# Patient Record
Sex: Female | Born: 1985 | Race: Black or African American | Hispanic: No | Marital: Married | Smoking: Never smoker
Health system: Southern US, Community
[De-identification: ages and names within clinical notes are randomized; demographics above are authoritative.]

## PROBLEM LIST (undated history)

## (undated) ENCOUNTER — Inpatient Hospital Stay (HOSPITAL_COMMUNITY): Payer: Self-pay

## (undated) DIAGNOSIS — IMO0002 Reserved for concepts with insufficient information to code with codable children: Secondary | ICD-10-CM

## (undated) DIAGNOSIS — R87629 Unspecified abnormal cytological findings in specimens from vagina: Secondary | ICD-10-CM

## (undated) DIAGNOSIS — R87619 Unspecified abnormal cytological findings in specimens from cervix uteri: Secondary | ICD-10-CM

## (undated) DIAGNOSIS — D649 Anemia, unspecified: Secondary | ICD-10-CM

## (undated) DIAGNOSIS — D219 Benign neoplasm of connective and other soft tissue, unspecified: Secondary | ICD-10-CM

## (undated) HISTORY — PX: NO PAST SURGERIES: SHX2092

## (undated) HISTORY — PX: WISDOM TOOTH EXTRACTION: SHX21

---

## 2013-08-08 ENCOUNTER — Encounter (HOSPITAL_COMMUNITY): Payer: Self-pay | Admitting: *Deleted

## 2013-08-08 ENCOUNTER — Inpatient Hospital Stay (HOSPITAL_COMMUNITY)
Admission: AD | Admit: 2013-08-08 | Discharge: 2013-08-08 | Disposition: A | Discharge status: Home / Self Care | Payer: 59 | Source: Ambulatory Visit | Attending: Obstetrics and Gynecology | Admitting: Obstetrics and Gynecology

## 2013-08-08 DIAGNOSIS — R109 Unspecified abdominal pain: Secondary | ICD-10-CM

## 2013-08-08 DIAGNOSIS — D259 Leiomyoma of uterus, unspecified: Secondary | ICD-10-CM | POA: Diagnosis not present

## 2013-08-08 DIAGNOSIS — O3410 Maternal care for benign tumor of corpus uteri, unspecified trimester: Secondary | ICD-10-CM | POA: Insufficient documentation

## 2013-08-08 DIAGNOSIS — O26899 Other specified pregnancy related conditions, unspecified trimester: Secondary | ICD-10-CM

## 2013-08-08 DIAGNOSIS — R1032 Left lower quadrant pain: Secondary | ICD-10-CM | POA: Diagnosis present

## 2013-08-08 HISTORY — DX: Unspecified abnormal cytological findings in specimens from cervix uteri: R87.619

## 2013-08-08 HISTORY — DX: Reserved for concepts with insufficient information to code with codable children: IMO0002

## 2013-08-08 LAB — URINALYSIS, ROUTINE W REFLEX MICROSCOPIC
Bilirubin Urine: NEGATIVE
Glucose, UA: NEGATIVE mg/dL
Hgb urine dipstick: NEGATIVE
Ketones, ur: NEGATIVE mg/dL
Nitrite: NEGATIVE
Specific Gravity, Urine: 1.02 (ref 1.005–1.030)
Urobilinogen, UA: 0.2 mg/dL (ref 0.0–1.0)
pH: 7 (ref 5.0–8.0)

## 2013-08-08 NOTE — MAU Note (Signed)
Complains of constant sharp abdominal pain in the LLQ. Denies vaginal bleeding and vaginal discharge.

## 2013-08-08 NOTE — MAU Provider Note (Signed)
History     CSN: [id number]  Arrival date and time: 08/08/13 2129   First Provider Initiated Contact with Patient 08/08/13 2214      Chief Complaint  Patient presents with  . Abdominal Pain   HPI This is a 27 y.o. female at [redacted]w[redacted]d who presents with c/o LLQ pain that is sharp in nature for one day. Called office at 1:30 today but it was "too late" by the time they called her back to go into office. No bleeding. Had normal [place] last week. Normal BMs daily . No Nausea or vomiting or fever.  States was told she had a baseball sized fibroid on uterus  Rn Note: Complains of constant sharp abdominal pain in the LLQ. Denies vaginal bleeding and vaginal discharge.        OB History   Grav Para Term Preterm Abortions TAB SAB Ect Mult Living   2    1  1          Past Medical History  Diagnosis Date  . Abnormal Pap smear     Past Surgical History  Procedure Laterality Date  . Wisdom tooth extraction      History reviewed. No pertinent family history.  History  Substance Use Topics  . Smoking status: Never Smoker   . Smokeless tobacco: Never Used  . Alcohol Use: No    Allergies: No Known Allergies  Prescriptions prior to admission  Medication Sig Dispense Refill  . Prenatal Vit-Fe Fumarate-FA (PRENATAL MULTIVITAMIN) TABS tablet Take 1 tablet by mouth daily at 12 noon.        Review of Systems  Constitutional: Negative for fever, chills and malaise/fatigue.  Gastrointestinal: Positive for abdominal pain. Negative for heartburn, nausea, vomiting, diarrhea and constipation.  Genitourinary: Negative for dysuria.  Musculoskeletal: Negative for myalgias.  Neurological: Negative for headaches.   Physical Exam   Blood pressure 122/72, pulse 80, temperature 98.3 F (36.8 C), temperature source Oral, resp. rate 18, height 5\' 5"  (1.651 m), weight 179 lb 9.6 oz (81.466 kg), last menstrual period 05/17/2013, SpO2 100.00%.  Physical Exam  Constitutional: She is oriented to  person, place, and time. She appears well-developed and well-nourished. No distress.  HENT:  Head: Normocephalic.  Cardiovascular: Normal rate.   Respiratory: Effort normal.  GI: Soft. She exhibits no distension and no mass. There is tenderness (LLQ tender to deep palpation). There is no rebound and no guarding.  Genitourinary: Vagina normal and uterus normal. No vaginal discharge found.  Cervix long and closed. Uterus anteverted.   Musculoskeletal: Normal range of motion.  Neurological: She is alert and oriented to person, place, and time.  Skin: Skin is warm and dry.  Psychiatric: She has a normal mood and affect.    MAU Course  Procedures  MDM Results for orders placed during the hospital encounter of 08/08/13 (from the past 24 hour(s))  URINALYSIS, ROUTINE W REFLEX MICROSCOPIC     Status: None   Collection Time    08/08/13  7:50 PM      Result Value Range   Color, Urine YELLOW  YELLOW   APPearance CLEAR  CLEAR   Specific Gravity, Urine 1.020  1.005 - 1.030   pH 7.0  5.0 - 8.0   Glucose, UA NEGATIVE  NEGATIVE mg/dL   Hgb urine dipstick NEGATIVE  NEGATIVE   Bilirubin Urine NEGATIVE  NEGATIVE   Ketones, ur NEGATIVE  NEGATIVE mg/dL   Protein, ur NEGATIVE  NEGATIVE mg/dL   Urobilinogen, UA 0.2  0.0 -  1.0 mg/dL   Nitrite NEGATIVE  NEGATIVE   Leukocytes, UA NEGATIVE  NEGATIVE     Assessment and Plan  A:  SIUP at [redacted]w[redacted]d       LLQ pain x 1 day      Known uterine fibroid  P:  Discussed with Dr Claiborne Billings      Supportive care, reassurance      Might benefit from pregnancy support belt      Followup in office      Call if develops bleeding or other symptoms   [facility] 08/08/2013, 10:37 PM

## 2013-08-09 ENCOUNTER — Encounter (HOSPITAL_COMMUNITY): Payer: Self-pay | Admitting: Emergency Medicine

## 2013-08-09 ENCOUNTER — Emergency Department (HOSPITAL_COMMUNITY)
Admission: EM | Admit: 2013-08-09 | Discharge: 2013-08-10 | Disposition: A | Discharge status: Home / Self Care | Payer: 59 | Attending: Emergency Medicine | Admitting: Emergency Medicine

## 2013-08-09 DIAGNOSIS — R1032 Left lower quadrant pain: Secondary | ICD-10-CM | POA: Diagnosis not present

## 2013-08-09 DIAGNOSIS — O9989 Other specified diseases and conditions complicating pregnancy, childbirth and the puerperium: Secondary | ICD-10-CM | POA: Insufficient documentation

## 2013-08-09 DIAGNOSIS — R102 Pelvic and perineal pain: Secondary | ICD-10-CM

## 2013-08-09 DIAGNOSIS — N949 Unspecified condition associated with female genital organs and menstrual cycle: Secondary | ICD-10-CM | POA: Diagnosis not present

## 2013-08-09 DIAGNOSIS — Z3490 Encounter for supervision of normal pregnancy, unspecified, unspecified trimester: Secondary | ICD-10-CM

## 2013-08-09 LAB — CBC WITH DIFFERENTIAL/PLATELET
Eosinophils Absolute: 0.1 10*3/uL (ref 0.0–0.7)
HCT: 33.5 % — ABNORMAL LOW (ref 36.0–46.0)
Hemoglobin: 11.9 g/dL — ABNORMAL LOW (ref 12.0–15.0)
Lymphocytes Relative: 22 % (ref 12–46)
Lymphs Abs: 2 10*3/uL (ref 0.7–4.0)
MCH: 31.9 pg (ref 26.0–34.0)
Monocytes Relative: 8 % (ref 3–12)
Neutrophils Relative %: 69 % (ref 43–77)
Platelets: 305 10*3/uL (ref 150–400)
RBC: 3.73 MIL/uL — ABNORMAL LOW (ref 3.87–5.11)
WBC: 9.3 10*3/uL (ref 4.0–10.5)

## 2013-08-09 LAB — COMPREHENSIVE METABOLIC PANEL
ALT: 8 U/L (ref 0–35)
AST: 16 U/L (ref 0–37)
Alkaline Phosphatase: 49 U/L (ref 39–117)
BUN: 10 mg/dL (ref 6–23)
CO2: 22 mEq/L (ref 19–32)
Calcium: 9.2 mg/dL (ref 8.4–10.5)
Chloride: 102 mEq/L (ref 96–112)
GFR calc Af Amer: >90 mL/min (ref >90)
GFR calc non Af Amer: >90 mL/min (ref >90)
Glucose, Bld: 81 mg/dL (ref 70–99)
Potassium: 3.8 mEq/L (ref 3.5–5.1)
Sodium: 134 mEq/L — ABNORMAL LOW (ref 135–145)
Total Bilirubin: 0.1 mg/dL — ABNORMAL LOW (ref 0.3–1.2)
Total Protein: 7.4 g/dL (ref 6.0–8.3)

## 2013-08-09 LAB — URINALYSIS, ROUTINE W REFLEX MICROSCOPIC
Bilirubin Urine: NEGATIVE
Hgb urine dipstick: NEGATIVE
Ketones, ur: 15 mg/dL — AB
Leukocytes, UA: NEGATIVE
Nitrite: NEGATIVE
Protein, ur: NEGATIVE mg/dL
Urobilinogen, UA: 1 mg/dL (ref 0.0–1.0)
pH: 6.5 (ref 5.0–8.0)

## 2013-08-09 MED ORDER — ACETAMINOPHEN 325 MG PO TABS
650.0000 mg | ORAL_TABLET | Freq: Once | ORAL | Status: AC
  Administered 2013-08-09: 650 mg via ORAL
  Filled 2013-08-09: qty 2

## 2013-08-09 NOTE — ED Notes (Signed)
13 weeks [preg lmp 9 21.  edc June 15th.  llq pain  Since yesterday am.  No nv no vaginal bleeding.

## 2013-08-09 NOTE — ED Notes (Signed)
Pt is having LLQ abdominal pain since yesterday. No n/v, diarrhea, or vaginal bleeding. No cardiac or respiratory distress. Will continue to monitor.

## 2013-08-09 NOTE — ED Provider Notes (Signed)
CSN: [id number]     Arrival date & time 08/09/13  2014 History   First MD Initiated Contact with Patient 08/09/13 2322     Chief Complaint  Patient presents with  . Abdominal Pain   (Consider location/radiation/quality/duration/timing/severity/associated sxs/prior Treatment) HPI Comments: 27 yo G2P0 at [redacted] weeks EGA presents with L pelvic pain.  Onset 1 day ago while at work. Pt denies trauma, no f/c, f/u/d, no VB, no VD.    Pt is o/w healthy with an unremarkable pregnancy.  Pt has an [place] proven IUP.  [place] last week.    Patient is a 27 y.o. female presenting with abdominal pain. The history is provided by the patient.  Abdominal Pain Pain location:  LLQ Pain quality: aching   Pain radiates to:  Does not radiate Onset quality:  Gradual Duration:  1 day Timing:  Constant Progression:  Unchanged Chronicity:  New Context comment:  Last intercourse 1 week ago Relieved by:  Nothing Worsened by:  Nothing tried Associated symptoms: no constipation, no cough, no diarrhea, no dysuria, no hematuria, no nausea, no shortness of breath, no sore throat, no vaginal bleeding, no vaginal discharge and no vomiting   Risk factors: pregnancy     Past Medical History  Diagnosis Date  . Abnormal Pap smear    Past Surgical History  Procedure Laterality Date  . Wisdom tooth extraction     No family history on file. History  Substance Use Topics  . Smoking status: Never Smoker   . Smokeless tobacco: Never Used  . Alcohol Use: No   OB History   Grav Para Term Preterm Abortions TAB SAB Ect Mult Living   2    1  1         Review of Systems  Constitutional: Negative.   HENT: Negative.  Negative for sore throat.   Eyes: Negative.   Respiratory: Negative.  Negative for cough, choking and shortness of breath.   Cardiovascular: Negative.   Gastrointestinal: Positive for abdominal pain. Negative for nausea, vomiting, diarrhea, constipation, blood in stool, abdominal distention, anal bleeding and  rectal pain.  Endocrine: Negative.   Genitourinary: Positive for pelvic pain. Negative for dysuria, frequency, hematuria, flank pain, vaginal bleeding, vaginal discharge, enuresis, difficulty urinating and dyspareunia.  Musculoskeletal: Negative.   Skin: Negative.   Neurological: Negative for seizures, syncope, speech difficulty, weakness and numbness.    Allergies  Review of patient's allergies indicates no known allergies.  Home Medications   Current Outpatient Rx  Name  Route  Sig  Dispense  Refill  . Prenatal Vit-Fe Fumarate-FA (PRENATAL MULTIVITAMIN) TABS tablet   Oral   Take 1 tablet by mouth daily at 12 noon.          BP 122/63  Pulse 80  Temp(Src) 98.5 F (36.9 C) (Oral)  Resp 18  Wt 176 lb 1 oz (79.861 kg)  SpO2 100%  LMP 05/17/2013 Physical Exam  Nursing note and vitals reviewed. Constitutional: She appears well-developed and well-nourished. No distress.  HENT:  Head: Normocephalic.  Mouth/Throat: Oropharynx is clear and moist. No oropharyngeal exudate.  Eyes: Conjunctivae are normal. Pupils are equal, round, and reactive to light. Right eye exhibits no discharge. Left eye exhibits no discharge.  Neck: Normal range of motion.  Cardiovascular: Normal rate and regular rhythm.   Pulmonary/Chest: Effort normal and breath sounds normal.  Abdominal: Soft. She exhibits no distension and no mass. There is tenderness. There is no rebound and no guarding. Hernia confirmed negative in the right inguinal  area and confirmed negative in the left inguinal area.  Mild ttp to L pelvic area, no g/r/m, no TTP at [place], neg [place], no CVAT  Genitourinary: Vagina normal. Pelvic exam was performed with patient supine. There is no rash, tenderness, lesion or injury on the right labia. There is no rash, tenderness, lesion or injury on the left labia. Uterus is enlarged. Cervix exhibits no motion tenderness, no discharge and no friability. Right adnexum displays tenderness. Right  adnexum displays no mass. Left adnexum displays no mass and no tenderness. No erythema, tenderness or bleeding around the vagina. No foreign body around the vagina. No signs of injury around the vagina. No vaginal discharge found.  No cervical motion tenderness. No adnexal masses or tenderness palpation. Uterus gravid without tenderness.  Skin: She is not diaphoretic.    ED Course  Procedures (including critical care time) Labs Review Results for orders placed during the hospital encounter of 08/09/13  WET PREP, GENITAL      Result Value Range   Yeast Wet Prep HPF POC NONE SEEN  NONE SEEN   Trich, Wet Prep NONE SEEN  NONE SEEN   Clue Cells Wet Prep HPF POC NONE SEEN  NONE SEEN   WBC, Wet Prep HPF POC MODERATE (*) NONE SEEN  CBC WITH DIFFERENTIAL      Result Value Range   WBC 9.3  4.0 - 10.5 K/uL   RBC 3.73 (*) 3.87 - 5.11 MIL/uL   Hemoglobin 11.9 (*) 12.0 - 15.0 g/dL   HCT 16.1 (*) 09.6 - 04.5 %   MCV 89.8  78.0 - 100.0 fL   MCH 31.9  26.0 - 34.0 pg   MCHC 35.5  30.0 - 36.0 g/dL   RDW 40.9  81.1 - 91.4 %   Platelets 305  150 - 400 K/uL   Neutrophils Relative % 69  43 - 77 %   Neutro Abs 6.4  1.7 - 7.7 K/uL   Lymphocytes Relative 22  12 - 46 %   Lymphs Abs 2.0  0.7 - 4.0 K/uL   Monocytes Relative 8  3 - 12 %   Monocytes Absolute 0.8  0.1 - 1.0 K/uL   Eosinophils Relative 1  0 - 5 %   Eosinophils Absolute 0.1  0.0 - 0.7 K/uL   Basophils Relative 0  0 - 1 %   Basophils Absolute 0.0  0.0 - 0.1 K/uL  COMPREHENSIVE METABOLIC PANEL      Result Value Range   Sodium 134 (*) 135 - 145 mEq/L   Potassium 3.8  3.5 - 5.1 mEq/L   Chloride 102  96 - 112 mEq/L   CO2 22  19 - 32 mEq/L   Glucose, Bld 81  70 - 99 mg/dL   BUN 10  6 - 23 mg/dL   Creatinine, Ser 7.82  0.50 - 1.10 mg/dL   Calcium 9.2  8.4 - 95.6 mg/dL   Total Protein 7.4  6.0 - 8.3 g/dL   Albumin 3.3 (*) 3.5 - 5.2 g/dL   AST 16  0 - 37 U/L   ALT 8  0 - 35 U/L   Alkaline Phosphatase 49  39 - 117 U/L   Total Bilirubin 0.1  (*) 0.3 - 1.2 mg/dL   GFR calc non Af Amer >90  >90 mL/min   GFR calc Af Amer >90  >90 mL/min  URINALYSIS, ROUTINE W REFLEX MICROSCOPIC      Result Value Range   Color, Urine YELLOW  YELLOW  APPearance CLEAR  CLEAR   Specific Gravity, Urine 1.026  1.005 - 1.030   pH 6.5  5.0 - 8.0   Glucose, UA NEGATIVE  NEGATIVE mg/dL   Hgb urine dipstick NEGATIVE  NEGATIVE   Bilirubin Urine NEGATIVE  NEGATIVE   Ketones, ur 15 (*) NEGATIVE mg/dL   Protein, ur NEGATIVE  NEGATIVE mg/dL   Urobilinogen, UA 1.0  0.0 - 1.0 mg/dL   Nitrite NEGATIVE  NEGATIVE   Leukocytes, UA NEGATIVE  NEGATIVE  POCT PREGNANCY, URINE      Result Value Range   Preg Test, Ur POSITIVE (*) NEGATIVE   Wet prep negative except for moderate wbc.    No results found.  EKG Interpretation   None      EMERGENCY DEPARTMENT [place] PREGNANCY "Study: Limited Ultrasound of the Pelvis"  INDICATIONS:Pregnancy(required) Multiple views of the uterus and pelvic cavity are obtained with a multi-frequency probe.  APPROACH:Transabdominal   PERFORMED BY: Myself  IMAGES ARCHIVED?: No  LIMITATIONS: Emergent procedure  PREGNANCY FREE FLUID: None  PREGNANCY UTERUS FINDINGS:Uterus enlarged ADNEXAL FINDINGS:Left ovary not seen and Right ovary not seen  PREGNANCY FINDINGS: Fetal pole present and Fetal heart activity seen  INTERPRETATION: Viable intrauterine pregnancy  GESTATIONAL AGE, ESTIMATE: 13  FETAL HEART RATE: 140s  COMMENT(Estimate of Gestational Age):  IUP with CA and FM     MDM   1. Pregnancy at early stage   2. Pelvic pain    27 yo G2P1 at [redacted] weeks EGA presents with L pelvic pain.  VSS.  Pt is afebrile. She denies f/c, n/v/d, f/u/d, vb, vd, lightheadedness, syncope, SOB or other symptoms.  Labwork unremarkable except for mild anemia and ketones in urine.  Abdomen is benign except for mild ttp to L pelvis area.  Pelvic exam was unremarkable. Cultures were sent for GC chlamydia and wet prep was sent. ER workup was  unremarkable to include pelvic exam and transabdominal ultrasound. Lengthy discussion with patient regarding her symptoms. I offered a short course of narcotic however she declined. At this point I do not suspect ovarian torsion, PID, ectopic, appendicitis or surgical abdomen, or other emergent etiology. More likely this is round ligament pain.  ER precautions were given for fever, worsening pain, vaginal bleeding, vaginal discharge, or any concerns. Patient agrees with the plan   Darlys Gales, MD 08/10/13 [phone]

## 2013-08-10 LAB — WET PREP, GENITAL
Clue Cells Wet Prep HPF POC: NONE SEEN
Trich, Wet Prep: NONE SEEN

## 2013-08-10 NOTE — ED Notes (Signed)
EDP at bedside  

## 2013-08-10 NOTE — ED Notes (Signed)
Doctor aware of pts pain.

## 2013-08-11 LAB — GC/CHLAMYDIA PROBE AMP: GC Probe RNA: NEGATIVE

## 2013-10-08 ENCOUNTER — Encounter (HOSPITAL_COMMUNITY): Payer: Self-pay | Admitting: *Deleted

## 2013-10-08 ENCOUNTER — Inpatient Hospital Stay (HOSPITAL_COMMUNITY)
Admission: AD | Admit: 2013-10-08 | Discharge: 2013-10-08 | Disposition: A | Discharge status: Home / Self Care | Payer: 59 | Source: Ambulatory Visit | Attending: Obstetrics and Gynecology | Admitting: Obstetrics and Gynecology

## 2013-10-08 ENCOUNTER — Other Ambulatory Visit: Payer: Self-pay | Admitting: Advanced Practice Midwife

## 2013-10-08 DIAGNOSIS — R109 Unspecified abdominal pain: Secondary | ICD-10-CM | POA: Insufficient documentation

## 2013-10-08 DIAGNOSIS — O9989 Other specified diseases and conditions complicating pregnancy, childbirth and the puerperium: Principal | ICD-10-CM

## 2013-10-08 DIAGNOSIS — R1013 Epigastric pain: Secondary | ICD-10-CM | POA: Insufficient documentation

## 2013-10-08 DIAGNOSIS — O99891 Other specified diseases and conditions complicating pregnancy: Secondary | ICD-10-CM | POA: Insufficient documentation

## 2013-10-08 DIAGNOSIS — R079 Chest pain, unspecified: Secondary | ICD-10-CM | POA: Diagnosis not present

## 2013-10-08 DIAGNOSIS — K219 Gastro-esophageal reflux disease without esophagitis: Secondary | ICD-10-CM

## 2013-10-08 MED ORDER — GI COCKTAIL ~~LOC~~
30.0000 mL | ORAL | Status: AC
  Administered 2013-10-08: 30 mL via ORAL
  Filled 2013-10-08: qty 30

## 2013-10-08 NOTE — MAU Note (Signed)
Patient states she has had epigastric pain for 2 days. Denies abdominal pain, bleeding, leaking. Has felt fetal movement. Denies S/S of the flu

## 2013-10-08 NOTE — MAU Provider Note (Signed)
Chief Complaint:  Abdominal Pain   First Provider Initiated Contact with Patient 10/08/13 1445      HPI: Barbara Bryant is a 28 y.o. G2P0010 at 35w2dwho presents to maternity admissions reporting sharp pain on the right side of her sternum, described as intermittent and worse when she coughs or takes a deep breath.  She reported she had this pain early in pregnancy but it resolved and has returned in the last few days.  She reports she has not had a recent respiratory infection, and does not cough frequently, but sometimes coughs after eating and vomits.  She has not taken any medication for heartburn or acid reflux during this pregnancy.  She reports good fetal movement, denies abdominal pain, LOF, vaginal bleeding, vaginal itching/burning, urinary symptoms, h/a, dizziness, n/v, or fever/chills.  .   Past Medical History: Past Medical History  Diagnosis Date  . Abnormal Pap smear     Past obstetric history: OB History  Gravida Para Term Preterm AB SAB TAB Ectopic Multiple Living  2    1 1         # Outcome Date GA Lbr Len/2nd Weight Sex Delivery Anes PTL Lv  2 CUR           1 SAB 2014              Past Surgical History: Past Surgical History  Procedure Laterality Date  . Wisdom tooth extraction      Family History: Family History  Problem Relation Age of Onset  . Heart disease Maternal Grandmother     Social History: History  Substance Use Topics  . Smoking status: Never Smoker   . Smokeless tobacco: Never Used  . Alcohol Use: No    Allergies: No Known Allergies  Meds:  Prescriptions prior to admission  Medication Sig Dispense Refill  . Prenatal Vit-Fe Fumarate-FA (PRENATAL MULTIVITAMIN) TABS tablet Take 1 tablet by mouth daily at 12 noon.        ROS: Pertinent findings in history of present illness.  Physical Exam  Blood pressure 127/81, pulse 92, temperature 97.9 F (36.6 C), temperature source Oral, resp. rate 20, last menstrual period 05/17/2013, SpO2  100.00%. GENERAL: Well-developed, well-nourished female in no acute distress.  HEENT: normocephalic HEART: normal rate, rhythm, heart sounds RESP: normal effort, lung sounds clear bilaterally, diminished in bases, more on right side, air exchange in all lobes CHEST: No tenderness to palpation  ABDOMEN: Soft, non-tender, gravid appropriate for gestational age EXTREMITIES: Nontender, no edema NEURO: alert and oriented     FHT:  145 by doppler   ED Course GI Cocktail with mild improvement in symptoms EKG normal sinus rhythm  Assessment: 1. Acid reflux     Plan: Consult Dr Ouida Sills Discharge home Try Zantac 150 mg BID for one week F/U in office Return to MAU as needed       Follow-up Information   Schedule an appointment as soon as possible for a visit with [place]. (If symptoms worsen)    Contact information:   [address] [place] [address] [phone]      Follow up with [place]. (As needed)    Contact information:   [address] [id number] [place] [address] [phone]       Medication List         prenatal multivitamin Tabs tablet  Take 1 tablet by mouth daily at 12 noon.  Fatima Blank Certified Nurse-Midwife 10/08/2013 4:19 PM

## 2013-10-08 NOTE — MAU Note (Signed)
C/o chest pain since yesterday morning;

## 2013-10-08 NOTE — Discharge Instructions (Signed)
Take Zantac (ranitidine) 150 mg twice per day for one week to see if symptoms improve.     Gastroesophageal Reflux Disease, Adult Gastroesophageal reflux disease (GERD) happens when acid from your stomach flows up into the esophagus. When acid comes in contact with the esophagus, the acid causes soreness (inflammation) in the esophagus. Over time, GERD may create small holes (ulcers) in the lining of the esophagus. CAUSES   Increased body weight. This puts pressure on the stomach, making acid rise from the stomach into the esophagus.  Smoking. This increases acid production in the stomach.  Drinking alcohol. This causes decreased pressure in the lower esophageal sphincter (valve or ring of muscle between the esophagus and stomach), allowing acid from the stomach into the esophagus.  Late evening meals and a full stomach. This increases pressure and acid production in the stomach.  A malformed lower esophageal sphincter.  Pregnancy Sometimes, no cause is found. SYMPTOMS   Burning pain in the lower part of the mid-chest behind the breastbone and in the mid-stomach area. This may occur twice a week or more often.  Trouble swallowing.  Sore throat.  Dry cough.  Asthma-like symptoms including chest tightness, shortness of breath, or wheezing. DIAGNOSIS  Your caregiver may be able to diagnose GERD based on your symptoms. In some cases, X-rays and other tests may be done to check for complications or to check the condition of your stomach and esophagus. TREATMENT  Your caregiver may recommend over-the-counter or prescription medicines to help decrease acid production. Ask your caregiver before starting or adding any new medicines.  HOME CARE INSTRUCTIONS   Change the factors that you can control. Ask your caregiver for guidance concerning weight loss, quitting smoking, and alcohol consumption.  Avoid foods and drinks that make your symptoms worse, such as:  Caffeine or alcoholic  drinks.  Chocolate.  Peppermint or mint flavorings.  Garlic and onions.  Spicy foods.  Citrus fruits, such as oranges, lemons, or limes.  Tomato-based foods such as sauce, chili, salsa, and pizza.  Fried and fatty foods.  Avoid lying down for the 3 hours prior to your bedtime or prior to taking a nap.  Eat small, frequent meals instead of large meals.  Wear loose-fitting clothing. Do not wear anything tight around your waist that causes pressure on your stomach.  Raise the head of your bed 6 to 8 inches with wood blocks to help you sleep. Extra pillows will not help.  Only take over-the-counter or prescription medicines for pain, discomfort, or fever as directed by your caregiver.  Do not take aspirin, ibuprofen, or other nonsteroidal anti-inflammatory drugs (NSAIDs). SEEK IMMEDIATE MEDICAL CARE IF:   You have pain in your arms, neck, jaw, teeth, or back.  Your pain increases or changes in intensity or duration.  You develop nausea, vomiting, or sweating (diaphoresis).  You develop shortness of breath, or you faint.  Your vomit is green, yellow, black, or looks like coffee grounds or blood.  Your stool is red, bloody, or black. These symptoms could be signs of other problems, such as heart disease, gastric bleeding, or esophageal bleeding. MAKE SURE YOU:   Understand these instructions.  Will watch your condition.  Will get help right away if you are not doing well or get worse. Document Released: 05/24/2005 Document Revised: 11/06/2011 Document Reviewed: 03/03/2011 [facility] Patient Information 2014 [place], [place].   Diet for Gastroesophageal Reflux Disease, Adult Reflux (acid reflux) is when acid from your stomach flows up into the esophagus. When  acid comes in contact with the esophagus, the acid causes irritation and soreness (inflammation) in the esophagus. When reflux happens often or so severely that it causes damage to the esophagus, it is called  gastroesophageal reflux disease (GERD). Nutrition therapy can help ease the discomfort of GERD. FOODS OR DRINKS TO AVOID OR LIMIT  Smoking or chewing tobacco. Nicotine is one of the most potent stimulants to acid production in the gastrointestinal tract.  Caffeinated and decaffeinated coffee and black tea.  Regular or low-calorie carbonated beverages or energy drinks (caffeine-free carbonated beverages are allowed).   Strong spices, such as black pepper, white pepper, red pepper, cayenne, curry powder, and chili powder.  Peppermint or spearmint.  Chocolate.  High-fat foods, including meats and fried foods. Extra added fats including oils, butter, salad dressings, and nuts. Limit these to less than 8 tsp per day.  Fruits and vegetables if they are not tolerated, such as citrus fruits or tomatoes.  Alcohol.  Any food that seems to aggravate your condition. If you have questions regarding your diet, call your caregiver or a registered dietitian. OTHER THINGS THAT MAY HELP GERD INCLUDE:   Eating your meals slowly, in a relaxed setting.  Eating 5 to 6 small meals per day instead of 3 large meals.  Eliminating food for a period of time if it causes distress.  Not lying down until 3 hours after eating a meal.  Keeping the head of your bed raised 6 to 9 inches (15 to 23 cm) by using a foam wedge or blocks under the legs of the bed. Lying flat may make symptoms worse.  Being physically active. Weight loss may be helpful in reducing reflux in overweight or obese adults.  Wear loose fitting clothing EXAMPLE MEAL PLAN This meal plan is approximately 2,000 calories based on [email or web address] meal planning guidelines. Breakfast   cup cooked oatmeal.  1 cup strawberries.  1 cup low-fat milk.  1 oz almonds. Snack  1 cup cucumber slices.  6 oz yogurt (made from low-fat or fat-free milk). Lunch  2 slice whole-wheat bread.  2 oz sliced [place].  2 tsp mayonnaise.  1 cup  blueberries.  1 cup snap peas. Snack  6 whole-wheat crackers.  1 oz string cheese. Dinner   cup brown rice.  1 cup mixed veggies.  1 tsp olive oil.  3 oz grilled fish. Document Released: 08/14/2005 Document Revised: 11/06/2011 Document Reviewed: 06/30/2011 [facility] Patient Information 2014 [place], [place].

## 2013-12-18 ENCOUNTER — Encounter (HOSPITAL_COMMUNITY): Payer: Self-pay | Admitting: *Deleted

## 2013-12-18 ENCOUNTER — Inpatient Hospital Stay (HOSPITAL_COMMUNITY)
Admission: AD | Admit: 2013-12-18 | Discharge: 2013-12-18 | Disposition: A | Discharge status: Home / Self Care | Payer: 59 | Source: Ambulatory Visit | Attending: Obstetrics & Gynecology | Admitting: Obstetrics & Gynecology

## 2013-12-18 DIAGNOSIS — O368190 Decreased fetal movements, unspecified trimester, not applicable or unspecified: Secondary | ICD-10-CM | POA: Insufficient documentation

## 2013-12-18 DIAGNOSIS — O4700 False labor before 37 completed weeks of gestation, unspecified trimester: Secondary | ICD-10-CM | POA: Diagnosis not present

## 2013-12-18 DIAGNOSIS — O368130 Decreased fetal movements, third trimester, not applicable or unspecified: Secondary | ICD-10-CM

## 2013-12-18 HISTORY — DX: Benign neoplasm of connective and other soft tissue, unspecified: D21.9

## 2013-12-18 HISTORY — DX: Unspecified abnormal cytological findings in specimens from vagina: R87.629

## 2013-12-18 HISTORY — DX: Anemia, unspecified: D64.9

## 2013-12-18 NOTE — Discharge Instructions (Signed)
Fetal Movement Counts Patient Name: ____[name]______________________________________________ Patient Due Date: _[birth year]___________________ Performing a fetal movement count is highly recommended in high-risk pregnancies, but it is good for every pregnant woman to do. Your caregiver may ask you to start counting fetal movements at 28 weeks of the pregnancy. Fetal movements often increase:  After eating a full meal.  After physical activity.  After eating or drinking something sweet or cold.  At rest. Pay attention to when you feel the baby is most active. This will help you notice a pattern of your baby's sleep and wake cycles and what factors contribute to an increase in fetal movement. It is important to perform a fetal movement count at the same time each day when your baby is normally most active.  HOW TO COUNT FETAL MOVEMENTS 1. Find a quiet and comfortable area to sit or lie down on your left side. Lying on your left side provides the best blood and oxygen circulation to your baby. 2. Write down the day and time on a sheet of paper or in a journal. 3. Start counting kicks, flutters, swishes, rolls, or jabs in a 2 hour period. You should feel at least 10 movements within 2 hours. 4. If you do not feel 10 movements in 2 hours, wait 2 3 hours and count again. Look for a change in the pattern or not enough counts in 2 hours. SEEK MEDICAL CARE IF:  You feel less than 10 counts in 2 hours, tried twice.  There is no movement in over an hour.  The pattern is changing or taking longer each day to reach 10 counts in 2 hours.  You feel the baby is not moving as he or she usually does. Date: ____________ Movements: ____________ Start time: ____________ Barbara Bryant time: ____________  Date: ____________ Movements: ____________ Start time: ____________ Barbara Bryant time: ____________ Date: ____________ Movements: ____________ Start time: ____________ Barbara Bryant time: ____________ Date: ____________  Movements: ____________ Start time: ____________ Barbara Bryant time: ____________ Date: ____________ Movements: ____________ Start time: ____________ Barbara Bryant time: ____________ Date: ____________ Movements: ____________ Start time: ____________ Barbara Bryant time: ____________ Date: ____________ Movements: ____________ Start time: ____________ Barbara Bryant time: ____________ Date: ____________ Movements: ____________ Start time: ____________ Barbara Bryant time: ____________  Date: ____________ Movements: ____________ Start time: ____________ Barbara Bryant time: ____________ Date: ____________ Movements: ____________ Start time: ____________ Barbara Bryant time: ____________ Date: ____________ Movements: ____________ Start time: ____________ Barbara Bryant time: ____________ Date: ____________ Movements: ____________ Start time: ____________ Barbara Bryant time: ____________ Date: ____________ Movements: ____________ Start time: ____________ Barbara Bryant time: ____________ Date: ____________ Movements: ____________ Start time: ____________ Barbara Bryant time: ____________ Date: ____________ Movements: ____________ Start time: ____________ Barbara Bryant time: ____________  Date: ____________ Movements: ____________ Start time: ____________ Barbara Bryant time: ____________ Date: ____________ Movements: ____________ Start time: ____________ Barbara Bryant time: ____________ Date: ____________ Movements: ____________ Start time: ____________ Barbara Bryant time: ____________ Date: ____________ Movements: ____________ Start time: ____________ Barbara Bryant time: ____________ Date: ____________ Movements: ____________ Start time: ____________ Barbara Bryant time: ____________ Date: ____________ Movements: ____________ Start time: ____________ Barbara Bryant time: ____________ Date: ____________ Movements: ____________ Start time: ____________ Barbara Bryant time: ____________  Date: ____________ Movements: ____________ Start time: ____________ Barbara Bryant time: ____________ Date: ____________ Movements: ____________ Start time:  ____________ Barbara Bryant time: ____________ Date: ____________ Movements: ____________ Start time: ____________ Barbara Bryant time: ____________ Date: ____________ Movements: ____________ Start time: ____________ Barbara Bryant time: ____________ Date: ____________ Movements: ____________ Start time: ____________ Barbara Bryant time: ____________ Date: ____________ Movements: ____________ Start time: ____________ Barbara Bryant time: ____________ Date: ____________ Movements: ____________ Start time: ____________ Barbara Bryant time: ____________  Date: ____________ Movements: ____________ Start time: ____________  Finish time: ____________ Date: ____________ Movements: ____________ Start time: ____________ Barbara Bryant time: ____________ Date: ____________ Movements: ____________ Start time: ____________ Barbara Bryant time: ____________ Date: ____________ Movements: ____________ Start time: ____________ Barbara Bryant time: ____________ Date: ____________ Movements: ____________ Start time: ____________ Barbara Bryant time: ____________ Date: ____________ Movements: ____________ Start time: ____________ Barbara Bryant time: ____________ Date: ____________ Movements: ____________ Start time: ____________ Barbara Bryant time: ____________  Date: ____________ Movements: ____________ Start time: ____________ Barbara Bryant time: ____________ Date: ____________ Movements: ____________ Start time: ____________ Barbara Bryant time: ____________ Date: ____________ Movements: ____________ Start time: ____________ Barbara Bryant time: ____________ Date: ____________ Movements: ____________ Start time: ____________ Barbara Bryant time: ____________ Date: ____________ Movements: ____________ Start time: ____________ Barbara Bryant time: ____________ Date: ____________ Movements: ____________ Start time: ____________ Barbara Bryant time: ____________ Date: ____________ Movements: ____________ Start time: ____________ Barbara Bryant time: ____________  Date: ____________ Movements: ____________ Start time: ____________ Barbara Bryant time: ____________ Date:  ____________ Movements: ____________ Start time: ____________ Barbara Bryant time: ____________ Date: ____________ Movements: ____________ Start time: ____________ Barbara Bryant time: ____________ Date: ____________ Movements: ____________ Start time: ____________ Barbara Bryant time: ____________ Date: ____________ Movements: ____________ Start time: ____________ Barbara Bryant time: ____________ Date: ____________ Movements: ____________ Start time: ____________ Barbara Bryant time: ____________ Date: ____________ Movements: ____________ Start time: ____________ Barbara Bryant time: ____________  Date: ____________ Movements: ____________ Start time: ____________ Barbara Bryant time: ____________ Date: ____________ Movements: ____________ Start time: ____________ Barbara Bryant time: ____________ Date: ____________ Movements: ____________ Start time: ____________ Barbara Bryant time: ____________ Date: ____________ Movements: ____________ Start time: ____________ Barbara Bryant time: ____________ Date: ____________ Movements: ____________ Start time: ____________ Barbara Bryant time: ____________ Date: ____________ Movements: ____________ Start time: ____________ Barbara Bryant time: ____________ Document Released: 09/13/2006 Document Revised: 07/31/2012 Document Reviewed: 06/10/2012 Exit[employer] Patient Information 2014 [employer].

## 2013-12-18 NOTE — MAU Provider Note (Signed)
Reviewed case with CNM and I agree with above note.   Shanetha Bradham  

## 2013-12-18 NOTE — MAU Provider Note (Signed)
  History     CSN: [id number]  Arrival date and time: 12/18/13 1727   First Provider Initiated Contact with Patient 12/18/13 1804      Chief Complaint  Patient presents with  . Decreased Fetal Movement   HPI This is a 28 y.o. female at [redacted]w[redacted]d who presents with c/o decreased fetal movement. Denies pain or bleeding.   RN NOte:  Baby not moving as much as usual. Denies any pain, bleeding or leaking. No problems with preg.       OB History   Grav Para Term Preterm Abortions TAB SAB Ect Mult Living   2    1  1          Past Medical History  Diagnosis Date  . Abnormal Pap smear   . Anemia   . Fibroid   . Vaginal Pap smear, abnormal     has not had followup    Past Surgical History  Procedure Laterality Date  . Wisdom tooth extraction    . No past surgeries      Family History  Problem Relation Age of Onset  . Heart disease Maternal Grandmother   . Hearing loss Neg Hx     History  Substance Use Topics  . Smoking status: Never Smoker   . Smokeless tobacco: Never Used  . Alcohol Use: No    Allergies: No Known Allergies  Prescriptions prior to admission  Medication Sig Dispense Refill  . Prenatal Vit-Fe Fumarate-FA (PRENATAL MULTIVITAMIN) TABS tablet Take 1 tablet by mouth daily at 12 noon.        Review of Systems  Constitutional: Negative for fever, chills and malaise/fatigue.  Gastrointestinal: Negative for nausea, vomiting, abdominal pain, diarrhea and constipation.  Genitourinary:       Decreased fetal movement  Neurological: Negative for dizziness.   Physical Exam   Blood pressure 115/67, pulse 90, temperature 97.6 F (36.4 C), resp. rate 16, last menstrual period 05/17/2013.  Physical Exam  Constitutional: She is oriented to person, place, and time. She appears well-developed and well-nourished. No distress.  HENT:  Head: Normocephalic.  Cardiovascular: Normal rate.   Respiratory: Effort normal.  GI: Soft. There is no tenderness.   Genitourinary:  FHR reactive with good variability and accels A few Braxton Hicks contractions at beginning, not felt by patient.  Musculoskeletal: Normal range of motion.  Neurological: She is alert and oriented to person, place, and time.  Skin: Skin is warm and dry.  Psychiatric: She has a normal mood and affect.    MAU Course  Procedures  MDM Reviewed with Dr Alwyn Pea. OK to discharge  Assessment and Plan  A:  SIUP at [redacted]w[redacted]d        Reassuring fetal heart rate tracing  P:  Discharge home      Kick counts  Seabron Spates 12/18/2013, 6:57 PM

## 2013-12-18 NOTE — MAU Note (Signed)
Baby not moving as much as usual.  Denies any pain, bleeding or leaking. No problems with preg.

## 2014-01-06 ENCOUNTER — Other Ambulatory Visit: Payer: Self-pay

## 2014-01-16 ENCOUNTER — Inpatient Hospital Stay (HOSPITAL_COMMUNITY)
Admission: AD | Admit: 2014-01-16 | Discharge: 2014-01-16 | Disposition: A | Discharge status: Home / Self Care | Payer: 59 | Source: Ambulatory Visit | Attending: Obstetrics and Gynecology | Admitting: Obstetrics and Gynecology

## 2014-01-16 ENCOUNTER — Encounter (HOSPITAL_COMMUNITY): Payer: Self-pay | Admitting: *Deleted

## 2014-01-16 ENCOUNTER — Inpatient Hospital Stay (HOSPITAL_COMMUNITY): Payer: 59

## 2014-01-16 DIAGNOSIS — B349 Viral infection, unspecified: Secondary | ICD-10-CM

## 2014-01-16 DIAGNOSIS — R059 Cough, unspecified: Secondary | ICD-10-CM | POA: Insufficient documentation

## 2014-01-16 DIAGNOSIS — R05 Cough: Secondary | ICD-10-CM | POA: Insufficient documentation

## 2014-01-16 DIAGNOSIS — B9789 Other viral agents as the cause of diseases classified elsewhere: Secondary | ICD-10-CM

## 2014-01-16 DIAGNOSIS — O9989 Other specified diseases and conditions complicating pregnancy, childbirth and the puerperium: Principal | ICD-10-CM

## 2014-01-16 DIAGNOSIS — O99891 Other specified diseases and conditions complicating pregnancy: Secondary | ICD-10-CM | POA: Insufficient documentation

## 2014-01-16 LAB — URINALYSIS, ROUTINE W REFLEX MICROSCOPIC
Bilirubin Urine: NEGATIVE
GLUCOSE, UA: NEGATIVE mg/dL
Ketones, ur: NEGATIVE mg/dL
Leukocytes, UA: NEGATIVE
Nitrite: NEGATIVE
Protein, ur: NEGATIVE mg/dL
Specific Gravity, Urine: 1.015 (ref 1.005–1.030)
Urobilinogen, UA: 0.2 mg/dL (ref 0.0–1.0)
pH: 6.5 (ref 5.0–8.0)

## 2014-01-16 LAB — CBC
HEMATOCRIT: 28.7 % — AB (ref 36.0–46.0)
Hemoglobin: 9.4 g/dL — ABNORMAL LOW (ref 12.0–15.0)
MCH: 28.7 pg (ref 26.0–34.0)
MCHC: 32.8 g/dL (ref 30.0–36.0)
MCV: 87.5 fL (ref 78.0–100.0)
Platelets: 282 10*3/uL (ref 150–400)
RBC: 3.28 MIL/uL — ABNORMAL LOW (ref 3.87–5.11)
RDW: 14 % (ref 11.5–15.5)
WBC: 13.3 10*3/uL — AB (ref 4.0–10.5)

## 2014-01-16 LAB — URINE MICROSCOPIC-ADD ON

## 2014-01-16 MED ORDER — LACTATED RINGERS IV BOLUS (SEPSIS)
1000.0000 mL | Freq: Once | INTRAVENOUS | Status: AC
  Administered 2014-01-16: 1000 mL via INTRAVENOUS

## 2014-01-16 MED ORDER — ACETAMINOPHEN 325 MG PO TABS
650.0000 mg | ORAL_TABLET | Freq: Once | ORAL | Status: AC
  Administered 2014-01-16: 650 mg via ORAL
  Filled 2014-01-16: qty 2

## 2014-01-16 NOTE — MAU Note (Signed)
PT SAYS  SHE AWOKE AT  0200-  SHIVERING  WITH CHILLS -  DID NOT TAKE HER TEMP.  NO SORE THROAT,  NO RUNNY NOSE   HAS BEEN COUGHING  2 WEEKS   - NONPRODUCTIVE,  NO VOMITING, NIO  DIARHHEA.   GETS [place] LAST ON  5-19-  VE  - CLOSED-   [facility]- NEXT APPOINTMENT -  DR WILL [facility] HER FOR C/S  AT 36 WEEKS.   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      GBS- POSITIVE.

## 2014-01-16 NOTE — MAU Provider Note (Signed)
Chief Complaint:  No chief complaint on file.   First Provider Initiated Contact with Patient 01/16/14 0455      HPI: Barbara Bryant is a 28 y.o. G2P0010 at [redacted]w[redacted]d who presents to maternity admissions reporting waking up with chills. Has not checked her temperature. Denies contractions, leakage of fluid or vaginal bleeding. Good fetal movement.   Had a cold w/ cough 2 weeks ago. Rare, lingering dry cough, mostly after eating. Other Sx resolved.   Pregnancy Course: Uncomplicated.  Past Medical History: Past Medical History  Diagnosis Date  . Abnormal Pap smear   . Anemia   . Fibroid   . Vaginal Pap smear, abnormal     has not had followup    Past obstetric history: OB History  Gravida Para Term Preterm AB SAB TAB Ectopic Multiple Living  2    1 1         # Outcome Date GA Lbr Len/2nd Weight Sex Delivery Anes PTL Lv  2 CUR           1 SAB 2014              Past Surgical History: Past Surgical History  Procedure Laterality Date  . Wisdom tooth extraction    . No past surgeries       Family History: Family History  Problem Relation Age of Onset  . Heart disease Maternal Grandmother   . Hearing loss Neg Hx     Social History: History  Substance Use Topics  . Smoking status: Never Smoker   . Smokeless tobacco: Never Used  . Alcohol Use: No    Allergies:  Allergies  Allergen Reactions  . Latex Other (See Comments)    Irritation with condoms    Meds:  Prescriptions prior to admission  Medication Sig Dispense Refill  . Prenatal Vit-Fe Fumarate-FA (PRENATAL MULTIVITAMIN) TABS tablet Take 1 tablet by mouth daily at 12 noon.      . valACYclovir (VALTREX) 500 MG tablet Take 500 mg by mouth 2 (two) times daily.        ROS: Pertinent positive findings in history of present illness. Neg for sore throat, nasal congestion, SON, HA, neck stiffness, abd pain, LOF, VB, dysuria, urgency, frequency, flank pain, hematuria or any other suspected source of infection.   Physical  Exam  Blood pressure 123/81, pulse 122, temperature 99.3 F (37.4 C), temperature source Axillary, resp. rate 20, height 5\' 3"  (1.6 m), weight 97.297 kg (214 lb 8 oz), last menstrual period 05/17/2013, SpO2 98.00%. GENERAL: Well-developed, well-nourished female in no acute distress. Well-appearing, but shivering.  HEENT: normocephalic. Throat clear. No congestion or rhinorrhea.  HEART: tachycardia. Reg Rhythm. No M/R/G. RESP: normal effort. CTAB ABDOMEN: Soft, non-tender, gravid appropriate for gestational age. No CVAT. EXTREMITIES: Nontender, no edema NEURO: alert and oriented PELVIC EXAM: NEFG, physiologic discharge, no blood, cervix 1 cm     FHT:  Baseline 150 , moderate variability, accelerations present, no decelerations Contractions: rare, mild   Labs: Results for orders placed during the hospital encounter of 01/16/14 (from the past 24 hour(s))  URINALYSIS, ROUTINE W REFLEX MICROSCOPIC     Status: Abnormal   Collection Time    01/16/14  3:15 AM      Result Value Ref Range   Color, Urine YELLOW  YELLOW   APPearance CLEAR  CLEAR   Specific Gravity, Urine 1.015  1.005 - 1.030   pH 6.5  5.0 - 8.0   Glucose, UA NEGATIVE  NEGATIVE mg/dL  Hgb urine dipstick TRACE (*) NEGATIVE   Bilirubin Urine NEGATIVE  NEGATIVE   Ketones, ur NEGATIVE  NEGATIVE mg/dL   Protein, ur NEGATIVE  NEGATIVE mg/dL   Urobilinogen, UA 0.2  0.0 - 1.0 mg/dL   Nitrite NEGATIVE  NEGATIVE   Leukocytes, UA NEGATIVE  NEGATIVE  URINE MICROSCOPIC-ADD ON     Status: Abnormal   Collection Time    01/16/14  3:15 AM      Result Value Ref Range   Squamous Epithelial / LPF FEW (*) RARE   WBC, UA 0-2  <3 WBC/hpf   RBC / HPF 0-2  <3 RBC/hpf   Bacteria, UA FEW (*) RARE  CBC     Status: Abnormal   Collection Time    01/16/14  4:27 AM      Result Value Ref Range   WBC 13.3 (*) 4.0 - 10.5 K/uL   RBC 3.28 (*) 3.87 - 5.11 MIL/uL   Hemoglobin 9.4 (*) 12.0 - 15.0 g/dL   HCT 28.7 (*) 36.0 - 46.0 %   MCV 87.5  78.0 -  100.0 fL   MCH 28.7  26.0 - 34.0 pg   MCHC 32.8  30.0 - 36.0 g/dL   RDW 14.0  11.5 - 15.5 %   Platelets 282  150 - 400 K/uL    Imaging:  Dg Chest 2 View  01/16/2014   CLINICAL DATA:  Fever and cough.  EXAM: CHEST  2 VIEW  COMPARISON:  No priors.  FINDINGS: Lung volumes are normal. No consolidative airspace disease. No pleural effusions. No pneumothorax. No pulmonary nodule or mass noted. Pulmonary vasculature and the cardiomediastinal silhouette are within normal limits.  IMPRESSION: No radiographic evidence of acute cardiopulmonary disease.   Electronically Signed   By: Vinnie Langton M.D.   On: 01/16/2014 06:04   MAU Course: Push PO fluids. CBC, UA.  FHR baseline rose to 170-180. Maternal temp rose to 101.8. IV bolus, tylenol given. Dr. Harrington Challenger notified. CXR ordered.  CXR normal. Temp 99.3. Fever still unexplained.   Assessment: 1. Viral syndrome   2. Other current maternal conditions classifiable elsewhere, antepartum     Plan: Discharge home in stable condition per consult w/ Dr. Harrington Challenger. Labor precautions and fetal kick counts. Discussed possible causes of fevers and red flags that should prompt return to MAU. Take Tylenol on schedule for the next 48 hours.  Follow-up Information   Follow up with [facility]. (As scheduled or as needed if symptoms worsens. )    Contact information:   [address] [address] [phone]      Follow up with [facility]. (As needed if symptoms worsen or if unable to control fever with Tylenol)    Contact information:   [address] [id number] [name] [address] [phone]       Medication List    ASK your doctor about these medications       prenatal multivitamin Tabs tablet  Take 1 tablet by mouth daily at 12 noon.     valACYclovir 500 MG tablet  Commonly known as:  VALTREX  Take 500 mg by mouth 2 (two)  times daily.        [place], [place] 01/16/2014 6:37 AM

## 2014-01-16 NOTE — MAU Note (Signed)
Pt denies urinary s/s as well

## 2014-01-16 NOTE — Discharge Instructions (Signed)
Take 650 mg of Tylenol every 4 hours as needed to keep your temperature below 100.4  Fever, Adult A fever is a higher than normal body temperature. In an adult, an oral temperature around 98.6 F (37 C) is considered normal. A temperature of 100.4 F (38 C) or higher is generally considered a fever. Mild or moderate fevers generally have no long-term effects and often do not require treatment. Extreme fever (greater than or equal to 106 F or 41.1 C) can cause seizures. The sweating that may occur with repeated or prolonged fever may cause dehydration. Elderly people can develop confusion during a fever. A measured temperature can vary with:  Age.  Time of day.  Method of measurement (mouth, underarm, rectal, or ear). The fever is confirmed by taking a temperature with a thermometer. Temperatures can be taken different ways. Some methods are accurate and some are not.  An oral temperature is used most commonly. Electronic thermometers are fast and accurate.  An ear temperature will only be accurate if the thermometer is positioned as recommended by the manufacturer.  A rectal temperature is accurate and done for those adults who have a condition where an oral temperature cannot be taken.  An underarm (axillary) temperature is not accurate and not recommended. Fever is a symptom, not a disease.  CAUSES   Infections commonly cause fever.  Some noninfectious causes for fever include:  Some arthritis conditions.  Some thyroid or adrenal gland conditions.  Some immune system conditions.  Some types of cancer.  A medicine reaction.  High doses of certain street drugs such as methamphetamine.  Dehydration.  Exposure to high outside or room temperatures.  Occasionally, the source of a fever cannot be determined. This is sometimes called a "fever of unknown origin" (FUO).  Some situations may lead to a temporary rise in body temperature that may go away on its own. Examples  are:  Childbirth.  Surgery.  Intense exercise. HOME CARE INSTRUCTIONS   Take appropriate medicines for fever. Follow dosing instructions carefully. If you use acetaminophen to reduce the fever, be careful to avoid taking other medicines that also contain acetaminophen. Do not take aspirin for a fever if you are younger than age 33. There is an association with Reye's syndrome. Reye's syndrome is a rare but potentially deadly disease.  If an infection is present and antibiotics have been prescribed, take them as directed. Finish them even if you start to feel better.  Rest as needed.  Maintain an adequate fluid intake. To prevent dehydration during an illness with prolonged or recurrent fever, you may need to drink extra fluid.Drink enough fluids to keep your urine clear or pale yellow.  Sponging or bathing with room temperature water may help reduce body temperature. Do not use ice water or alcohol sponge baths.  Dress comfortably, but do not over-bundle. SEEK MEDICAL CARE IF:   You are unable to keep fluids down.  You develop vomiting or diarrhea.  You are not feeling at least partly better after 3 days.  You develop new symptoms or problems. SEEK IMMEDIATE MEDICAL CARE IF:   You have shortness of breath or trouble breathing.  You develop excessive weakness.  You are dizzy or you faint.  You are extremely thirsty or you are making little or no urine.  You develop new pain that was not there before (such as in the head, neck, chest, back, or abdomen).  You have persistant vomiting and diarrhea for more than 1 to 2 days.  You develop a stiff neck or your eyes become sensitive to light.  You develop a skin rash.  You have a fever or persistent symptoms for more than 2 to 3 days.  You have a fever and your symptoms suddenly get worse. MAKE SURE YOU:   Understand these instructions.  Will watch your condition.  Will get help right away if you are not doing well or  get worse. Document Released: 02/07/2001 Document Revised: 11/06/2011 Document Reviewed: 06/15/2011 [facility] Patient Information 2014 [place], [place].   Antibiotic Nonuse  Your caregiver felt that the infection or problem was not one that would be helped with an antibiotic. Infections may be caused by viruses or bacteria. Only a caregiver can tell which one of these is the likely cause of an illness. A cold is the most common cause of infection in both adults and children. A cold is a virus. Antibiotic treatment will have no effect on a viral infection. Viruses can lead to many lost days of work caring for sick children and many missed days of school. Children may catch as many as 10 "colds" or "flus" per year during which they can be tearful, cranky, and uncomfortable. The goal of treating a virus is aimed at keeping the ill person comfortable. Antibiotics are medications used to help the body fight bacterial infections. There are relatively few types of bacteria that cause infections but there are hundreds of viruses. While both viruses and bacteria cause infection they are very different types of germs. A viral infection will typically go away by itself within 7 to 10 days. Bacterial infections may spread or get worse without antibiotic treatment. Examples of bacterial infections are:  Sore throats (like strep throat or tonsillitis).  Infection in the lung (pneumonia).  Ear and skin infections. Examples of viral infections are:  Colds or flus.  Most coughs and bronchitis.  Sore throats not caused by Strep.  Runny noses. It is often best not to take an antibiotic when a viral infection is the cause of the problem. Antibiotics can kill off the helpful bacteria that we have inside our body and allow harmful bacteria to start growing. Antibiotics can cause side effects such as allergies, nausea, and diarrhea without helping to improve the symptoms of the viral infection. Additionally,  repeated uses of antibiotics can cause bacteria inside of our body to become resistant. That resistance can be passed onto harmful bacterial. The next time you have an infection it may be harder to treat if antibiotics are used when they are not needed. Not treating with antibiotics allows our own immune system to develop and take care of infections more efficiently. Also, antibiotics will work better for [place] when they are prescribed for bacterial infections. Treatments for a child that is ill may include:  Give extra fluids throughout the day to stay hydrated.  Get plenty of rest.  Only give your child over-the-counter or prescription medicines for pain, discomfort, or fever as directed by your caregiver.  The use of a cool mist humidifier may help stuffy noses.  Cold medications if suggested by your caregiver. Your caregiver may decide to start you on an antibiotic if:  The problem you were seen for today continues for a longer length of time than expected.  You develop a secondary bacterial infection. SEEK MEDICAL CARE IF:  Fever lasts longer than 5 days.  Symptoms continue to get worse after 5 to 7 days or become severe.  Difficulty in breathing develops.  Signs of  dehydration develop (poor drinking, rare urinating, dark colored urine).  Changes in behavior or worsening tiredness (listlessness or lethargy). Document Released: 10/23/2001 Document Revised: 11/06/2011 Document Reviewed: 04/21/2009 [facility] Patient Information 2014 [place], [place].  Fetal Movement Counts Patient Name: __________________________________________________ Patient Due Date: ____________________ Performing a fetal movement count is highly recommended in high-risk pregnancies, but it is good for every pregnant woman to do. Your caregiver may ask you to start counting fetal movements at 28 weeks of the pregnancy. Fetal movements often increase:  After eating a full meal.  After physical activity.  After  eating or drinking something sweet or cold.  At rest. Pay attention to when you feel the baby is most active. This will help you notice a pattern of your baby's sleep and wake cycles and what factors contribute to an increase in fetal movement. It is important to perform a fetal movement count at the same time each day when your baby is normally most active.  HOW TO COUNT FETAL MOVEMENTS 1. Find a quiet and comfortable area to sit or lie down on your left side. Lying on your left side provides the best blood and oxygen circulation to your baby. 2. Write down the day and time on a sheet of paper or in a journal. 3. Start counting kicks, flutters, swishes, rolls, or jabs in a 2 hour period. You should feel at least 10 movements within 2 hours. 4. If you do not feel 10 movements in 2 hours, wait 2 3 hours and count again. Look for a change in the pattern or not enough counts in 2 hours. SEEK MEDICAL CARE IF:  You feel less than 10 counts in 2 hours, tried twice.  There is no movement in over an hour.  The pattern is changing or taking longer each day to reach 10 counts in 2 hours.  You feel the baby is not moving as he or she usually does. Date: ____________ Movements: ____________ Start time: ____________ Elizebeth Koller time: ____________  Date: ____________ Movements: ____________ Start time: ____________ Elizebeth Koller time: ____________ Date: ____________ Movements: ____________ Start time: ____________ Elizebeth Koller time: ____________ Date: ____________ Movements: ____________ Start time: ____________ Elizebeth Koller time: ____________ Date: ____________ Movements: ____________ Start time: ____________ Elizebeth Koller time: ____________ Date: ____________ Movements: ____________ Start time: ____________ Elizebeth Koller time: ____________ Date: ____________ Movements: ____________ Start time: ____________ Elizebeth Koller time: ____________ Date: ____________ Movements: ____________ Start time: ____________ Elizebeth Koller time: ____________  Date:  ____________ Movements: ____________ Start time: ____________ Elizebeth Koller time: ____________ Date: ____________ Movements: ____________ Start time: ____________ Elizebeth Koller time: ____________ Date: ____________ Movements: ____________ Start time: ____________ Elizebeth Koller time: ____________ Date: ____________ Movements: ____________ Start time: ____________ Elizebeth Koller time: ____________ Date: ____________ Movements: ____________ Start time: ____________ Elizebeth Koller time: ____________ Date: ____________ Movements: ____________ Start time: ____________ Elizebeth Koller time: ____________ Date: ____________ Movements: ____________ Start time: ____________ Elizebeth Koller time: ____________  Date: ____________ Movements: ____________ Start time: ____________ Elizebeth Koller time: ____________ Date: ____________ Movements: ____________ Start time: ____________ Elizebeth Koller time: ____________ Date: ____________ Movements: ____________ Start time: ____________ Elizebeth Koller time: ____________ Date: ____________ Movements: ____________ Start time: ____________ Elizebeth Koller time: ____________ Date: ____________ Movements: ____________ Start time: ____________ Elizebeth Koller time: ____________ Date: ____________ Movements: ____________ Start time: ____________ Elizebeth Koller time: ____________ Date: ____________ Movements: ____________ Start time: ____________ Elizebeth Koller time: ____________  Date: ____________ Movements: ____________ Start time: ____________ Elizebeth Koller time: ____________ Date: ____________ Movements: ____________ Start time: ____________ Elizebeth Koller time: ____________ Date: ____________ Movements: ____________ Start time: ____________ Elizebeth Koller time: ____________ Date: ____________ Movements: ____________ Start time: ____________ Elizebeth Koller time: ____________ Date: ____________ Movements: ____________  Start time: ____________ Elizebeth Koller time: ____________ Date: ____________ Movements: ____________ Start time: ____________ Elizebeth Koller time: ____________ Date: ____________ Movements: ____________ Start  time: ____________ Elizebeth Koller time: ____________  Date: ____________ Movements: ____________ Start time: ____________ Elizebeth Koller time: ____________ Date: ____________ Movements: ____________ Start time: ____________ Elizebeth Koller time: ____________ Date: ____________ Movements: ____________ Start time: ____________ Elizebeth Koller time: ____________ Date: ____________ Movements: ____________ Start time: ____________ Elizebeth Koller time: ____________ Date: ____________ Movements: ____________ Start time: ____________ Elizebeth Koller time: ____________ Date: ____________ Movements: ____________ Start time: ____________ Elizebeth Koller time: ____________ Date: ____________ Movements: ____________ Start time: ____________ Elizebeth Koller time: ____________  Date: ____________ Movements: ____________ Start time: ____________ Elizebeth Koller time: ____________ Date: ____________ Movements: ____________ Start time: ____________ Elizebeth Koller time: ____________ Date: ____________ Movements: ____________ Start time: ____________ Elizebeth Koller time: ____________ Date: ____________ Movements: ____________ Start time: ____________ Elizebeth Koller time: ____________ Date: ____________ Movements: ____________ Start time: ____________ Elizebeth Koller time: ____________ Date: ____________ Movements: ____________ Start time: ____________ Elizebeth Koller time: ____________ Date: ____________ Movements: ____________ Start time: ____________ Elizebeth Koller time: ____________  Date: ____________ Movements: ____________ Start time: ____________ Elizebeth Koller time: ____________ Date: ____________ Movements: ____________ Start time: ____________ Elizebeth Koller time: ____________ Date: ____________ Movements: ____________ Start time: ____________ Elizebeth Koller time: ____________ Date: ____________ Movements: ____________ Start time: ____________ Elizebeth Koller time: ____________ Date: ____________ Movements: ____________ Start time: ____________ Elizebeth Koller time: ____________ Date: ____________ Movements: ____________ Start time: ____________ Elizebeth Koller time:  ____________ Date: ____________ Movements: ____________ Start time: ____________ Elizebeth Koller time: ____________  Date: ____________ Movements: ____________ Start time: ____________ Elizebeth Koller time: ____________ Date: ____________ Movements: ____________ Start time: ____________ Elizebeth Koller time: ____________ Date: ____________ Movements: ____________ Start time: ____________ Elizebeth Koller time: ____________ Date: ____________ Movements: ____________ Start time: ____________ Elizebeth Koller time: ____________ Date: ____________ Movements: ____________ Start time: ____________ Elizebeth Koller time: ____________ Date: ____________ Movements: ____________ Start time: ____________ Elizebeth Koller time: ____________ Document Released: 09/13/2006 Document Revised: 07/31/2012 Document Reviewed: 06/10/2012 [employer] Patient Information 2014 [place].

## 2014-01-17 ENCOUNTER — Inpatient Hospital Stay (HOSPITAL_COMMUNITY): Payer: 59 | Admitting: Anesthesiology

## 2014-01-17 ENCOUNTER — Inpatient Hospital Stay (HOSPITAL_COMMUNITY)
Admission: AD | Admit: 2014-01-17 | Discharge: 2014-01-20 | DRG: 765 | Disposition: A | Discharge status: Home / Self Care | Payer: 59 | Source: Ambulatory Visit | Attending: Obstetrics and Gynecology | Admitting: Obstetrics and Gynecology

## 2014-01-17 ENCOUNTER — Encounter (HOSPITAL_COMMUNITY): Payer: 59 | Admitting: Anesthesiology

## 2014-01-17 ENCOUNTER — Inpatient Hospital Stay (HOSPITAL_COMMUNITY): Payer: 59

## 2014-01-17 ENCOUNTER — Encounter (HOSPITAL_COMMUNITY)
Admission: AD | Disposition: A | Discharge status: Home / Self Care | Payer: Self-pay | Source: Ambulatory Visit | Attending: Obstetrics and Gynecology

## 2014-01-17 ENCOUNTER — Encounter (HOSPITAL_COMMUNITY): Payer: Self-pay

## 2014-01-17 DIAGNOSIS — D649 Anemia, unspecified: Secondary | ICD-10-CM | POA: Diagnosis present

## 2014-01-17 DIAGNOSIS — O411090 Infection of amniotic sac and membranes, unspecified, unspecified trimester, not applicable or unspecified: Secondary | ICD-10-CM | POA: Diagnosis present

## 2014-01-17 DIAGNOSIS — O321XX0 Maternal care for breech presentation, not applicable or unspecified: Principal | ICD-10-CM | POA: Diagnosis present

## 2014-01-17 DIAGNOSIS — Z9889 Other specified postprocedural states: Secondary | ICD-10-CM

## 2014-01-17 DIAGNOSIS — O9902 Anemia complicating childbirth: Secondary | ICD-10-CM | POA: Diagnosis present

## 2014-01-17 LAB — URINALYSIS, ROUTINE W REFLEX MICROSCOPIC
Bilirubin Urine: NEGATIVE
Glucose, UA: NEGATIVE mg/dL
Ketones, ur: >80 mg/dL — AB
LEUKOCYTES UA: NEGATIVE
Nitrite: NEGATIVE
PROTEIN: NEGATIVE mg/dL
Specific Gravity, Urine: 1.02 (ref 1.005–1.030)
UROBILINOGEN UA: 0.2 mg/dL (ref 0.0–1.0)
pH: 6 (ref 5.0–8.0)

## 2014-01-17 LAB — URINE MICROSCOPIC-ADD ON

## 2014-01-17 LAB — COMPREHENSIVE METABOLIC PANEL
ALBUMIN: 2.6 g/dL — AB (ref 3.5–5.2)
ALT: 11 U/L (ref 0–35)
AST: 17 U/L (ref 0–37)
Alkaline Phosphatase: 159 U/L — ABNORMAL HIGH (ref 39–117)
BILIRUBIN TOTAL: 0.3 mg/dL (ref 0.3–1.2)
BUN: 9 mg/dL (ref 6–23)
CHLORIDE: 99 meq/L (ref 96–112)
CO2: 19 meq/L (ref 19–32)
CREATININE: 0.73 mg/dL (ref 0.50–1.10)
Calcium: 8.4 mg/dL (ref 8.4–10.5)
GFR calc Af Amer: >90 mL/min (ref >90)
Glucose, Bld: 90 mg/dL (ref 70–99)
Potassium: 3.3 mEq/L — ABNORMAL LOW (ref 3.7–5.3)
Sodium: 133 mEq/L — ABNORMAL LOW (ref 137–147)
Total Protein: 6 g/dL (ref 6.0–8.3)

## 2014-01-17 LAB — CBC
HCT: 28 % — ABNORMAL LOW (ref 36.0–46.0)
Hemoglobin: 9.2 g/dL — ABNORMAL LOW (ref 12.0–15.0)
MCH: 28.5 pg (ref 26.0–34.0)
MCHC: 32.9 g/dL (ref 30.0–36.0)
MCV: 86.7 fL (ref 78.0–100.0)
PLATELETS: 241 10*3/uL (ref 150–400)
RBC: 3.23 MIL/uL — ABNORMAL LOW (ref 3.87–5.11)
RDW: 13.9 % (ref 11.5–15.5)
WBC: 13.2 10*3/uL — ABNORMAL HIGH (ref 4.0–10.5)

## 2014-01-17 LAB — GLUCOSE, CAPILLARY: GLUCOSE-CAPILLARY: 51 mg/dL — AB (ref 70–99)

## 2014-01-17 LAB — ABO/RH: ABO/RH(D): A POS

## 2014-01-17 LAB — TYPE AND SCREEN
ABO/RH(D): A POS
Antibody Screen: NEGATIVE

## 2014-01-17 LAB — RPR

## 2014-01-17 SURGERY — Surgical Case
Anesthesia: Spinal | Site: Abdomen

## 2014-01-17 MED ORDER — SCOPOLAMINE 1 MG/3DAYS TD PT72
MEDICATED_PATCH | TRANSDERMAL | Status: AC
  Filled 2014-01-17: qty 1

## 2014-01-17 MED ORDER — LANOLIN HYDROUS EX OINT: 1.0000 "application " | TOPICAL_OINTMENT | CUTANEOUS | Status: DC | PRN

## 2014-01-17 MED ORDER — ONDANSETRON HCL 4 MG PO TABS: 4.0000 mg | ORAL_TABLET | ORAL | Status: DC | PRN

## 2014-01-17 MED ORDER — ONDANSETRON HCL 4 MG/2ML IJ SOLN: 4.0000 mg | Freq: Three times a day (TID) | INTRAMUSCULAR | Status: DC | PRN

## 2014-01-17 MED ORDER — PIPERACILLIN-TAZOBACTAM 3.375 G IVPB: 3.3750 g | Freq: Three times a day (TID) | INTRAVENOUS | Status: DC

## 2014-01-17 MED ORDER — PIPERACILLIN-TAZOBACTAM 3.375 G IVPB
3.3750 g | Freq: Three times a day (TID) | INTRAVENOUS | Status: DC
  Administered 2014-01-17 (×2): 3.375 g via INTRAVENOUS
  Filled 2014-01-17 (×6): qty 50

## 2014-01-17 MED ORDER — TETANUS-DIPHTH-ACELL PERTUSSIS 5-2.5-18.5 LF-MCG/0.5 IM SUSP
0.5000 mL | Freq: Once | INTRAMUSCULAR | Status: DC
  Filled 2014-01-17: qty 0.5

## 2014-01-17 MED ORDER — IBUPROFEN 600 MG PO TABS
600.0000 mg | ORAL_TABLET | Freq: Four times a day (QID) | ORAL | Status: DC
  Administered 2014-01-17 (×2): 600 mg via ORAL
  Filled 2014-01-17 (×2): qty 1

## 2014-01-17 MED ORDER — NALBUPHINE HCL 10 MG/ML IJ SOLN: 5.0000 mg | INTRAMUSCULAR | Status: DC | PRN

## 2014-01-17 MED ORDER — LACTATED RINGERS IV SOLN
INTRAVENOUS | Status: DC | PRN
  Administered 2014-01-17 (×4): via INTRAVENOUS

## 2014-01-17 MED ORDER — ONDANSETRON HCL 4 MG/2ML IJ SOLN
INTRAMUSCULAR | Status: DC | PRN
  Administered 2014-01-17: 4 mg via INTRAVENOUS

## 2014-01-17 MED ORDER — LACTATED RINGERS IV BOLUS (SEPSIS)
1000.0000 mL | Freq: Once | INTRAVENOUS | Status: AC
  Administered 2014-01-17: 1000 mL via INTRAVENOUS

## 2014-01-17 MED ORDER — FERROUS SULFATE 325 (65 FE) MG PO TABS
325.0000 mg | ORAL_TABLET | Freq: Two times a day (BID) | ORAL | Status: DC
  Administered 2014-01-17: 325 mg via ORAL
  Filled 2014-01-17 (×5): qty 1

## 2014-01-17 MED ORDER — MEPERIDINE HCL 25 MG/ML IJ SOLN: 6.2500 mg | INTRAMUSCULAR | Status: DC | PRN

## 2014-01-17 MED ORDER — BISACODYL 10 MG RE SUPP
10.0000 mg | Freq: Every day | RECTAL | Status: DC | PRN
  Filled 2014-01-17: qty 1

## 2014-01-17 MED ORDER — FAMOTIDINE IN NACL 20-0.9 MG/50ML-% IV SOLN
20.0000 mg | Freq: Two times a day (BID) | INTRAVENOUS | Status: DC
  Administered 2014-01-17: 20 mg via INTRAVENOUS
  Filled 2014-01-17 (×3): qty 50

## 2014-01-17 MED ORDER — OXYTOCIN 10 UNIT/ML IJ SOLN
40.0000 [IU] | INTRAVENOUS | Status: DC | PRN
  Administered 2014-01-17: 40 [IU] via INTRAVENOUS

## 2014-01-17 MED ORDER — LACTATED RINGERS IV SOLN
INTRAVENOUS | Status: DC
  Administered 2014-01-17: 12:00:00 via INTRAVENOUS

## 2014-01-17 MED ORDER — PROMETHAZINE HCL 25 MG/ML IJ SOLN
25.0000 mg | Freq: Four times a day (QID) | INTRAMUSCULAR | Status: DC | PRN
  Administered 2014-01-17: 25 mg via INTRAVENOUS
  Filled 2014-01-17: qty 1

## 2014-01-17 MED ORDER — OXYTOCIN 10 UNIT/ML IJ SOLN
INTRAMUSCULAR | Status: AC
  Filled 2014-01-17: qty 4

## 2014-01-17 MED ORDER — KETOROLAC TROMETHAMINE 30 MG/ML IJ SOLN: 30.0000 mg | Freq: Four times a day (QID) | INTRAMUSCULAR | Status: AC | PRN

## 2014-01-17 MED ORDER — DIBUCAINE 1 % RE OINT
1.0000 "application " | TOPICAL_OINTMENT | RECTAL | Status: DC | PRN
  Filled 2014-01-17: qty 28

## 2014-01-17 MED ORDER — MORPHINE SULFATE 0.5 MG/ML IJ SOLN
INTRAMUSCULAR | Status: AC
  Filled 2014-01-17: qty 10

## 2014-01-17 MED ORDER — PHENYLEPHRINE 8 MG IN D5W 100 ML (0.08MG/ML) PREMIX OPTIME
INJECTION | INTRAVENOUS | Status: DC | PRN
Start: 2014-01-17 — End: 2014-01-17
  Administered 2014-01-17: 60 ug/min via INTRAVENOUS

## 2014-01-17 MED ORDER — PHENYLEPHRINE HCL 10 MG/ML IJ SOLN
INTRAMUSCULAR | Status: AC
  Filled 2014-01-17: qty 1

## 2014-01-17 MED ORDER — PRENATAL MULTIVITAMIN CH
1.0000 | ORAL_TABLET | Freq: Every day | ORAL | Status: DC
  Filled 2014-01-17 (×2): qty 1

## 2014-01-17 MED ORDER — SENNOSIDES-DOCUSATE SODIUM 8.6-50 MG PO TABS
2.0000 | ORAL_TABLET | ORAL | Status: DC
  Administered 2014-01-17 – 2014-01-19 (×3): 2 via ORAL
  Filled 2014-01-17 (×5): qty 2

## 2014-01-17 MED ORDER — OXYTOCIN 40 UNITS IN LACTATED RINGERS INFUSION - SIMPLE MED: 62.5000 mL/h | INTRAVENOUS | Status: AC

## 2014-01-17 MED ORDER — SIMETHICONE 80 MG PO CHEW
80.0000 mg | CHEWABLE_TABLET | ORAL | Status: DC
  Filled 2014-01-17 (×3): qty 1

## 2014-01-17 MED ORDER — KETOROLAC TROMETHAMINE 30 MG/ML IJ SOLN
30.0000 mg | Freq: Four times a day (QID) | INTRAMUSCULAR | Status: AC | PRN
  Administered 2014-01-17: 30 mg via INTRAMUSCULAR

## 2014-01-17 MED ORDER — HYDROMORPHONE HCL PF 1 MG/ML IJ SOLN: 0.2500 mg | INTRAMUSCULAR | Status: DC | PRN

## 2014-01-17 MED ORDER — KETOROLAC TROMETHAMINE 30 MG/ML IJ SOLN
INTRAMUSCULAR | Status: AC
  Filled 2014-01-17: qty 1

## 2014-01-17 MED ORDER — DIPHENHYDRAMINE HCL 25 MG PO CAPS
25.0000 mg | ORAL_CAPSULE | Freq: Four times a day (QID) | ORAL | Status: DC | PRN
  Filled 2014-01-17: qty 1

## 2014-01-17 MED ORDER — DIPHENHYDRAMINE HCL 25 MG PO CAPS
25.0000 mg | ORAL_CAPSULE | ORAL | Status: DC | PRN
  Filled 2014-01-17: qty 1

## 2014-01-17 MED ORDER — SIMETHICONE 80 MG PO CHEW
80.0000 mg | CHEWABLE_TABLET | Freq: Three times a day (TID) | ORAL | Status: DC
  Administered 2014-01-17 – 2014-01-20 (×7): 80 mg via ORAL
  Filled 2014-01-17 (×13): qty 1

## 2014-01-17 MED ORDER — DIPHENHYDRAMINE HCL 50 MG/ML IJ SOLN: 12.5000 mg | INTRAMUSCULAR | Status: DC | PRN

## 2014-01-17 MED ORDER — SIMETHICONE 80 MG PO CHEW
80.0000 mg | CHEWABLE_TABLET | ORAL | Status: DC | PRN
  Filled 2014-01-17: qty 1

## 2014-01-17 MED ORDER — MORPHINE SULFATE (PF) 0.5 MG/ML IJ SOLN
INTRAMUSCULAR | Status: DC | PRN
  Administered 2014-01-17: .15 mg via INTRATHECAL

## 2014-01-17 MED ORDER — ACETAMINOPHEN 10 MG/ML IV SOLN
1000.0000 mg | Freq: Once | INTRAVENOUS | Status: AC
  Administered 2014-01-17: 1000 mg via INTRAVENOUS
  Filled 2014-01-17: qty 100

## 2014-01-17 MED ORDER — SCOPOLAMINE 1 MG/3DAYS TD PT72
1.0000 | MEDICATED_PATCH | Freq: Once | TRANSDERMAL | Status: AC
  Administered 2014-01-17: 1.5 mg via TRANSDERMAL

## 2014-01-17 MED ORDER — NALOXONE HCL 0.4 MG/ML IJ SOLN: 0.4000 mg | INTRAMUSCULAR | Status: DC | PRN

## 2014-01-17 MED ORDER — MEASLES, MUMPS & RUBELLA VAC ~~LOC~~ INJ
0.5000 mL | INJECTION | Freq: Once | SUBCUTANEOUS | Status: DC
  Filled 2014-01-17: qty 0.5

## 2014-01-17 MED ORDER — ZOLPIDEM TARTRATE 5 MG PO TABS: 5.0000 mg | ORAL_TABLET | Freq: Every evening | ORAL | Status: DC | PRN

## 2014-01-17 MED ORDER — FENTANYL CITRATE 0.05 MG/ML IJ SOLN
INTRAMUSCULAR | Status: AC
  Filled 2014-01-17: qty 2

## 2014-01-17 MED ORDER — OXYCODONE-ACETAMINOPHEN 5-325 MG PO TABS
1.0000 | ORAL_TABLET | ORAL | Status: DC | PRN
  Administered 2014-01-17: 1 via ORAL
  Filled 2014-01-17: qty 1

## 2014-01-17 MED ORDER — FLEET ENEMA 7-19 GM/118ML RE ENEM: 1.0000 | ENEMA | Freq: Every day | RECTAL | Status: DC | PRN

## 2014-01-17 MED ORDER — PHENYLEPHRINE HCL 10 MG/ML IJ SOLN: INTRAMUSCULAR | Status: DC | PRN

## 2014-01-17 MED ORDER — WITCH HAZEL-GLYCERIN EX PADS: 1.0000 "application " | MEDICATED_PAD | CUTANEOUS | Status: DC | PRN

## 2014-01-17 MED ORDER — METHYLERGONOVINE MALEATE 0.2 MG/ML IJ SOLN: 0.2000 mg | INTRAMUSCULAR | Status: DC | PRN

## 2014-01-17 MED ORDER — METHYLERGONOVINE MALEATE 0.2 MG PO TABS: 0.2000 mg | ORAL_TABLET | ORAL | Status: DC | PRN

## 2014-01-17 MED ORDER — FAMOTIDINE IN NACL 20-0.9 MG/50ML-% IV SOLN
20.0000 mg | Freq: Once | INTRAVENOUS | Status: AC
  Administered 2014-01-17: 20 mg via INTRAVENOUS
  Filled 2014-01-17: qty 50

## 2014-01-17 MED ORDER — PIPERACILLIN-TAZOBACTAM 3.375 G IVPB 30 MIN
3.3750 g | Freq: Once | INTRAVENOUS | Status: AC
  Administered 2014-01-17: 3.375 g via INTRAVENOUS
  Filled 2014-01-17: qty 50

## 2014-01-17 MED ORDER — DIPHENHYDRAMINE HCL 50 MG/ML IJ SOLN: 25.0000 mg | INTRAMUSCULAR | Status: DC | PRN

## 2014-01-17 MED ORDER — METOCLOPRAMIDE HCL 5 MG/ML IJ SOLN: 10.0000 mg | Freq: Three times a day (TID) | INTRAMUSCULAR | Status: DC | PRN

## 2014-01-17 MED ORDER — LACTATED RINGERS IV SOLN
INTRAVENOUS | Status: DC | PRN
  Administered 2014-01-17: 07:00:00 via INTRAVENOUS

## 2014-01-17 MED ORDER — MENTHOL 3 MG MT LOZG
1.0000 | LOZENGE | OROMUCOSAL | Status: DC | PRN
  Filled 2014-01-17: qty 9

## 2014-01-17 MED ORDER — CEFAZOLIN SODIUM-DEXTROSE 2-3 GM-% IV SOLR
INTRAVENOUS | Status: DC | PRN
  Administered 2014-01-17: 2 g via INTRAVENOUS

## 2014-01-17 MED ORDER — NALOXONE HCL 1 MG/ML IJ SOLN
1.0000 ug/kg/h | INTRAVENOUS | Status: DC | PRN
  Filled 2014-01-17 (×5): qty 2

## 2014-01-17 MED ORDER — CITRIC ACID-SODIUM CITRATE 334-500 MG/5ML PO SOLN
30.0000 mL | Freq: Once | ORAL | Status: DC
  Filled 2014-01-17: qty 15

## 2014-01-17 MED ORDER — ONDANSETRON HCL 4 MG/2ML IJ SOLN
INTRAMUSCULAR | Status: AC
  Filled 2014-01-17: qty 2

## 2014-01-17 MED ORDER — FENTANYL CITRATE 0.05 MG/ML IJ SOLN
INTRAMUSCULAR | Status: DC | PRN
  Administered 2014-01-17: 25 ug via INTRATHECAL

## 2014-01-17 MED ORDER — SODIUM CHLORIDE 0.9 % IJ SOLN: 3.0000 mL | INTRAMUSCULAR | Status: DC | PRN

## 2014-01-17 MED ORDER — ONDANSETRON HCL 4 MG/2ML IJ SOLN
4.0000 mg | INTRAMUSCULAR | Status: DC | PRN
  Administered 2014-01-17: 4 mg via INTRAVENOUS
  Filled 2014-01-17: qty 2

## 2014-01-17 SURGICAL SUPPLY — 45 items
BENZOIN TINCTURE PRP APPL 2/3 (GAUZE/BANDAGES/DRESSINGS) ×3 IMPLANT
CLAMP CORD UMBIL (MISCELLANEOUS) ×3 IMPLANT
CLOSURE WOUND 1/2 X4 (GAUZE/BANDAGES/DRESSINGS) ×1
CLOSURE WOUND 1/4X4 (GAUZE/BANDAGES/DRESSINGS) ×1
CLOTH BEACON ORANGE TIMEOUT ST (SAFETY) ×3 IMPLANT
DRAPE LG THREE QUARTER DISP (DRAPES) ×3 IMPLANT
DRSG OPSITE POSTOP 4X10 (GAUZE/BANDAGES/DRESSINGS) ×3 IMPLANT
DURAPREP 26ML APPLICATOR (WOUND CARE) ×3 IMPLANT
ELECT REM PT RETURN 9FT ADLT (ELECTROSURGICAL) ×3
ELECTRODE REM PT RTRN 9FT ADLT (ELECTROSURGICAL) ×1 IMPLANT
EXTRACTOR VACUUM BELL STYLE (SUCTIONS) IMPLANT
GLOVE BIO SURGEON STRL SZ7 (GLOVE) IMPLANT
GLOVE BIOGEL PI IND STRL 7.0 (GLOVE) ×1 IMPLANT
GLOVE BIOGEL PI IND STRL 7.5 (GLOVE) ×1 IMPLANT
GLOVE BIOGEL PI INDICATOR 7.0 (GLOVE) ×2
GLOVE BIOGEL PI INDICATOR 7.5 (GLOVE) ×2
GLOVE SURG SS PI 7.0 STRL IVOR (GLOVE) ×6 IMPLANT
GLOVE SURG SS PI 7.5 STRL IVOR (GLOVE) ×3 IMPLANT
GOWN STRL REUS W/TWL LRG LVL3 (GOWN DISPOSABLE) ×6 IMPLANT
KIT ABG SYR 3ML LUER SLIP (SYRINGE) ×3 IMPLANT
NDL SAFETY ECLIPSE 18X1.5 (NEEDLE) ×1 IMPLANT
NEEDLE HYPO 18GX1.5 SHARP (NEEDLE) ×2
NEEDLE HYPO 25X5/8 SAFETYGLIDE (NEEDLE) ×3 IMPLANT
NS IRRIG 1000ML POUR BTL (IV SOLUTION) ×3 IMPLANT
PACK C SECTION WH (CUSTOM PROCEDURE TRAY) ×3 IMPLANT
PAD OB MATERNITY 4.3X12.25 (PERSONAL CARE ITEMS) ×3 IMPLANT
RETRACTOR WND ALEXIS 25 LRG (MISCELLANEOUS) ×1 IMPLANT
RTRCTR WOUND ALEXIS 25CM LRG (MISCELLANEOUS) ×3
STAPLER VISISTAT 35W (STAPLE) IMPLANT
STRIP CLOSURE SKIN 1/2X4 (GAUZE/BANDAGES/DRESSINGS) ×2 IMPLANT
STRIP CLOSURE SKIN 1/4X4 (GAUZE/BANDAGES/DRESSINGS) ×2 IMPLANT
SUT MNCRL 0 VIOLET CTX 36 (SUTURE) ×2 IMPLANT
SUT MONOCRYL 0 CTX 36 (SUTURE) ×4
SUT PDS AB 0 CTX 60 (SUTURE) IMPLANT
SUT PLAIN 2 0 XLH (SUTURE) ×3 IMPLANT
SUT VIC AB 0 CT1 27 (SUTURE) ×4
SUT VIC AB 0 CT1 27XBRD ANBCTR (SUTURE) ×2 IMPLANT
SUT VIC AB 2-0 CT1 27 (SUTURE) ×2
SUT VIC AB 2-0 CT1 TAPERPNT 27 (SUTURE) ×1 IMPLANT
SUT VIC AB 4-0 KS 27 (SUTURE) ×3 IMPLANT
SYRINGE 10CC LL (SYRINGE) ×3 IMPLANT
TOWEL OR 17X24 6PK STRL BLUE (TOWEL DISPOSABLE) ×3 IMPLANT
TRAY FOLEY BAG SILVER LF 14FR (CATHETERS) ×3 IMPLANT
TRAY FOLEY CATH 14FR (SET/KITS/TRAYS/PACK) IMPLANT
WATER STERILE IRR 1000ML POUR (IV SOLUTION) ×3 IMPLANT

## 2014-01-17 NOTE — Lactation Note (Signed)
This note was copied from the chart of Barbara Bryant. Lactation Consultation Note  Patient Name: Barbara Bryant Today's Date: 01/17/2014 Reason for consult: Initial assessment Baby 13 hours of life. Mom and FOB visiting baby in NICU. Mom holding baby. Assisted mom to latch baby to breast. Mom states this is the second time she is nursing baby at breast. Mom states she is pumping every 3 hours and had sent the first EBM up to NICU earlier. Mom reports baby sleepy at first nursing. Baby latches well, deeply with wide open gape. Baby settles into a rhythmic pattern of sucking and resting. Mom lightly stimulates baby's arm to keep baby suckling. Several swallows were heard as the baby nurse actively for 15 minutes. Colostrum noted in baby's mouth and on mom's right nipple. Mom held baby comfortably cross-cradle. Baby given to FOB to hold upright and burp as mom was starting to nod off to sleep. Enc mom to continue to pump and send EBM up to NICU for baby. 59 NICU nurse consulted regarding effective nursing for 15 minutes and FOB holding baby when [facility] left NICU.   Maternal Data Formula Feeding for Exclusion: No Has patient been taught Hand Expression?: Yes Does the patient have breastfeeding experience prior to this delivery?: No  Feeding Feeding Type: Breast Fed Length of feed: 15 min  LATCH Score/Interventions Latch: Grasps breast easily, tongue down, lips flanged, rhythmical sucking.  Audible Swallowing: A few with stimulation Intervention(s): Hand expression  Type of Nipple: Everted at rest and after stimulation  Comfort (Breast/Nipple): Soft / non-tender     Hold (Positioning): Assistance needed to correctly position infant at breast and maintain latch.  LATCH Score: 8  Lactation Tools Discussed/Used     Consult Status Consult Status: Follow-up Date: 01/18/14 Follow-up type: In-patient    Andres Labrum 01/17/2014, 9:00 PM

## 2014-01-17 NOTE — Transfer of Care (Signed)
Immediate Anesthesia Transfer of Care Note  Patient: Barbara Bryant  Procedure(s) Performed: Procedure(s): CESAREAN SECTION (N/A)  Patient Location: PACU  Anesthesia Type:Spinal  Level of Consciousness: awake and alert   Airway & Oxygen Therapy: Patient Spontanous Breathing  Post-op Assessment: Report given to PACU RN and Post -op Vital signs reviewed and stable  Post vital signs: Reviewed and stable  Complications: No apparent anesthesia complications

## 2014-01-17 NOTE — Anesthesia Postprocedure Evaluation (Signed)
  Anesthesia Post-op Note  Patient: Barbara Bryant  Procedure(s) Performed: Procedure(s): CESAREAN SECTION (N/A)  Patient Location: PACU  Anesthesia Type:Spinal  Level of Consciousness: awake, alert  and oriented  Airway and Oxygen Therapy: Patient Spontanous Breathing  Post-op Pain: none  Post-op Assessment: Post-op Vital signs reviewed, Patient's Cardiovascular Status Stable, Respiratory Function Stable, Patent Airway, No signs of Nausea or vomiting, Pain level controlled, No headache and No backache  Post-op Vital Signs: Reviewed and stable  Last Vitals:  Filed Vitals:   01/17/14 0830  BP: 112/52  Pulse: 100  Temp:   Resp: 17    Complications: No apparent anesthesia complications

## 2014-01-17 NOTE — MAU Note (Signed)
Awoke about 0100 with chills. Having some lower abd pain. Denies leaking fld or bleeding.

## 2014-01-17 NOTE — Anesthesia Postprocedure Evaluation (Signed)
  Anesthesia Post-op Note  Patient: Barbara Bryant  Procedure(s) Performed: Procedure(s): CESAREAN SECTION (N/A)  Patient Location: [facility] Unit  Anesthesia Type:Spinal  Level of Consciousness: awake and alert   Airway and Oxygen Therapy: Patient Spontanous Breathing  Post-op Pain: mild  Post-op Assessment: Post-op Vital signs reviewed, No signs of Nausea or vomiting, Pain level controlled, No headache, No residual numbness and No residual motor weakness  Post-op Vital Signs: Reviewed  Last Vitals:  Filed Vitals:   01/17/14 1445  BP: 105/60  Pulse: 87  Temp: 36.6 C  Resp: 20    Complications: No apparent anesthesia complications

## 2014-01-17 NOTE — Progress Notes (Signed)
[place] Breech, AFI 18, BPP 6/10- no movement or tone.  Several late appearing variables just before pt taken off for [place].  Given suspicion of chorio, near term infant, breech and inability to document fetal well being, will proceed with LTCS.  All R/B/Alt d/w pt and they agree to proceed.

## 2014-01-17 NOTE — H&P (Signed)
28 y.o. [redacted]w[redacted]d G2P0010 comes in c/o chills and high fever.  Pt presented yesterday with fever and chills and exam was essentially normal and she was sent home.  Otherwise has good fetal movement and no bleeding.  Pt was told she was breech recently.  CXR yesterday was negative for infection or any lung abnormalities. Chief Complaint   Patient presents with   .  Abdominal Pain   .  Fever    Lower abd pain since 0100- new onset and having pain in between contractions as well. Has been eating normally but had one episode of vomiting this am.  Pt c/o some swelling in her legs bilaterally but no redness or calf tenderness. +FM Contractions frequently  10/10 pain with contractions  Low back pain  No abnormal discharge  No LOF  No vaginal bleeding  No dysuria  No sick contacts  No dysuria No HA or nuchal rigidity No recent HSV outbreaks- had been on valtrex briefly for prophylaxis but stopped GC/CT were both negative on 5-12 at office GBS is POS   Past Medical History  Diagnosis Date  . Abnormal Pap smear   . Anemia   . Fibroid   . Vaginal Pap smear, abnormal     has not had followup    Past Surgical History  Procedure Laterality Date  . Wisdom tooth extraction    . No past surgeries      OB History  Gravida Para Term Preterm AB SAB TAB Ectopic Multiple Living  _0 # Outcome Date GA Lbr Len/2nd Weight Sex Delivery Anes PTL Lv  2 CUR           1 SAB 2014              History   Social History  . Marital Status: Single    Spouse Name: N/A    Number of Children: N/A  . Years of Education: N/A   Occupational History  . Not on file.   Social History Main Topics  . Smoking status: Never Smoker   . Smokeless tobacco: Never Used  . Alcohol Use: No  . Drug Use: No  . Sexual Activity: Yes   Other Topics Concern  . Not on file   Social History Narrative  . No narrative on file   Latex    Prenatal Transfer Tool  Maternal Diabetes: No Genetic  Screening: Normal Maternal Ultrasounds/Referrals: Normal Fetal Ultrasounds or other Referrals:  None Maternal Substance Abuse:  No Significant Maternal Medications:  None Significant Maternal Lab Results: None  Other PNC: uncomplicated.  Pt has had growth scans for fibroids-biggest has been 6-7 cm.    Filed Vitals:   01/17/14 0345 01/17/14 0518 01/17/14 0519  BP: 104/51  111/48  Pulse: 130  135  Temp: 103.2 F (39.6 C)  100.6 F (38.1 C)  TempSrc:   Oral  Resp: 22  20  Height: _1  (1.626 m)    Weight: 97.07 kg (214 lb)    SpO2: 99% 97%    HEENT clear throat, no nuchal rigidity Lungs/Cor:  CTA B; tachy but normal rhythm; no CVAT Abdomen:  soft, gravid, tender across uterus - some extra tenderness LLQ Ex:  no cords, erythema; no homans; 1+ edema to shins SSE neg fern, pool, valsalva; bacteria only seen on slide; no lesions or ulcerations seen on cervix, vagina, or vulva SVE:  1/th/high- cervix is very tender- CMT FHTs:  140-150s, good STV, NST R; no decels seen Toco:  q 5  Results for orders placed during the hospital encounter of 01/17/14 (from the past 72 hour(s))  CBC     Status: Abnormal   Collection Time    01/17/14  4:50 AM      Result Value Ref Range   WBC 13.2 (*) 4.0 - 10.5 K/uL   RBC 3.23 (*) 3.87 - 5.11 MIL/uL   Hemoglobin 9.2 (*) 12.0 - 15.0 g/dL   HCT 28.0 (*) 36.0 - 46.0 %   MCV 86.7  78.0 - 100.0 fL   MCH 28.5  26.0 - 34.0 pg   MCHC 32.9  30.0 - 36.0 g/dL   RDW 13.9  11.5 - 15.5 %   Platelets 241  150 - 400 K/uL  COMPREHENSIVE METABOLIC PANEL     Status: Abnormal   Collection Time    01/17/14  4:50 AM      Result Value Ref Range   Sodium 133 (*) 137 - 147 mEq/L   Potassium 3.3 (*) 3.7 - 5.3 mEq/L   Chloride 99  96 - 112 mEq/L   CO2 19  19 - 32 mEq/L   Glucose, Bld 90  70 - 99 mg/dL   BUN 9  6 - 23 mg/dL   Creatinine, Ser 0.73  0.50 - 1.10 mg/dL   Calcium 8.4  8.4 - 10.5 mg/dL   Total Protein 6.0  6.0 - 8.3 g/dL   Albumin 2.6 (*) 3.5 - 5.2  g/dL   AST 17  0 - 37 U/L   ALT 11  0 - 35 U/L   Alkaline Phosphatase 159 (*) 39 - 117 U/L   Total Bilirubin 0.3  0.3 - 1.2 mg/dL   GFR calc non Af Amer >90  >90 mL/min   GFR calc Af Amer >90  >90 mL/min   Comment: (NOTE)     The eGFR has been calculated using the CKD EPI equation.     This calculation has not been validated in all clinical situations.     eGFR's persistently <90 mL/min signify possible Chronic Kidney     Disease.    A/P   28 y.o. G2P0010 27w5dwith high fever and moderate white count.  No signs of pyelo, pneumonia, DVT, meningitis, strep throat.  Now pain localizing to abdomen with addition of painful contractions:  Diff dx appendicitis, chorio.  GBS POS as of 5-12; GC/CT done at that time was neg/neg.  Will get [place] presentation, AFI and BPP.  If labor or oligo, may need to proceed with C/S if breech.  If not in labor and not breech, may need to do Amnio if enough fluid to r/o chorio.  Will consider CT of abdomen if Appy is suspected- currently, pain is worse on L than R and pt has not had excessive N/V(still tolerating PO fluids.)  UA today is pending.    Will start IV and give IV tylenol;  Awaiting [place] determine next step- holding antibiotics until diagnosis is pinned down.    MDaria Pastures

## 2014-01-17 NOTE — MAU Provider Note (Signed)
History     CSN: [id number]  Arrival date and time: 01/17/14 6144   First Provider Initiated Contact with Patient 01/17/14 [phone]      Chief Complaint  Patient presents with  . Abdominal Pain  . Fever   HPI Ms. Barbara Bryant is a 28 y.o. G2P0010 at [redacted]w[redacted]d who presents to MAU today with complaint of fever, chills and abdominal pain since 0100 today. The patient was in MAU yesterday with fever and had CBC with WBCs of 13.3, negative UA and negative CXR. She was afebrile at time of discharge. She states that she woke up at 0100 today with chills and presumed fever had returned. She is also complaining of mid to lower abdominal pain since she woke up today. She is having frequent contractions and rates her pain at 10/10 with contractions. She is also having mild lower back pain. She denies vaginal bleeding, discharge, LOF, dysuria or sick contacts. She reports good fetal movement.   OB History   Grav Para Term Preterm Abortions TAB SAB Ect Mult Living   2    1  1          Past Medical History  Diagnosis Date  . Abnormal Pap smear   . Anemia   . Fibroid   . Vaginal Pap smear, abnormal     has not had followup    Past Surgical History  Procedure Laterality Date  . Wisdom tooth extraction    . No past surgeries      Family History  Problem Relation Age of Onset  . Heart disease Maternal Grandmother   . Hearing loss Neg Hx     History  Substance Use Topics  . Smoking status: Never Smoker   . Smokeless tobacco: Never Used  . Alcohol Use: No    Allergies:  Allergies  Allergen Reactions  . Latex Other (See Comments)    Irritation with condoms    Prescriptions prior to admission  Medication Sig Dispense Refill  . acetaminophen (TYLENOL) 325 MG tablet Take 650 mg by mouth every 6 (six) hours as needed.      . Prenatal Vit-Fe Fumarate-FA (PRENATAL MULTIVITAMIN) TABS tablet Take 1 tablet by mouth daily at 12 noon.      . valACYclovir (VALTREX) 500 MG tablet Take 500 mg by  mouth 2 (two) times daily.        Review of Systems  Constitutional: Positive for fever, chills and malaise/fatigue.  Gastrointestinal: Positive for vomiting and abdominal pain. Negative for nausea, diarrhea and constipation.  Genitourinary: Negative for dysuria, urgency and frequency.       Neg - vaginal bleeding, discharge, LOF  Musculoskeletal: Positive for back pain.   Physical Exam   Blood pressure 111/48, pulse 135, temperature 100.6 F (38.1 C), temperature source Oral, resp. rate 20, height 5\' 4"  (1.626 m), weight 214 lb (97.07 kg), last menstrual period 05/17/2013, SpO2 97.00%.  Physical Exam  Constitutional: She is oriented to person, place, and time. She appears well-developed and well-nourished. No distress.  HENT:  Head: Normocephalic and atraumatic.  Cardiovascular: Regular rhythm and normal heart sounds.  Tachycardia present.   Respiratory: Effort normal and breath sounds normal. No respiratory distress.  GI: Soft. Bowel sounds are normal. She exhibits no distension and no mass. There is tenderness (mid abdominal tenderness to palpation). There is no rebound, no guarding and no CVA tenderness.  Musculoskeletal: She exhibits edema (1+ pitting to the mid shin). She exhibits no tenderness.  Neurological: She is alert and  oriented to person, place, and time.  Skin: Skin is warm and dry. No erythema.  Psychiatric: She has a normal mood and affect.  Dilation: Closed Exam by:: Rozelle Logan PA   Results for orders placed during the hospital encounter of 01/17/14 (from the past 24 hour(s))  CBC     Status: Abnormal   Collection Time    01/17/14  4:50 AM      Result Value Ref Range   WBC 13.2 (*) 4.0 - 10.5 K/uL   RBC 3.23 (*) 3.87 - 5.11 MIL/uL   Hemoglobin 9.2 (*) 12.0 - 15.0 g/dL   HCT 28.0 (*) 36.0 - 46.0 %   MCV 86.7  78.0 - 100.0 fL   MCH 28.5  26.0 - 34.0 pg   MCHC 32.9  30.0 - 36.0 g/dL   RDW 13.9  11.5 - 15.5 %   Platelets 241  150 - 400 K/uL  COMPREHENSIVE  METABOLIC PANEL     Status: Abnormal   Collection Time    01/17/14  4:50 AM      Result Value Ref Range   Sodium 133 (*) 137 - 147 mEq/L   Potassium 3.3 (*) 3.7 - 5.3 mEq/L   Chloride 99  96 - 112 mEq/L   CO2 19  19 - 32 mEq/L   Glucose, Bld 90  70 - 99 mg/dL   BUN 9  6 - 23 mg/dL   Creatinine, Ser 0.73  0.50 - 1.10 mg/dL   Calcium 8.4  8.4 - 10.5 mg/dL   Total Protein 6.0  6.0 - 8.3 g/dL   Albumin 2.6 (*) 3.5 - 5.2 g/dL   AST 17  0 - 37 U/L   ALT 11  0 - 35 U/L   Alkaline Phosphatase 159 (*) 39 - 117 U/L   Total Bilirubin 0.3  0.3 - 1.2 mg/dL   GFR calc non Af Amer >90  >90 mL/min   GFR calc Af Amer >90  >90 mL/min  URINALYSIS, ROUTINE W REFLEX MICROSCOPIC     Status: Abnormal   Collection Time    01/17/14  5:12 AM      Result Value Ref Range   Color, Urine YELLOW  YELLOW   APPearance CLEAR  CLEAR   Specific Gravity, Urine 1.020  1.005 - 1.030   pH 6.0  5.0 - 8.0   Glucose, UA NEGATIVE  NEGATIVE mg/dL   Hgb urine dipstick SMALL (*) NEGATIVE   Bilirubin Urine NEGATIVE  NEGATIVE   Ketones, ur >80 (*) NEGATIVE mg/dL   Protein, ur NEGATIVE  NEGATIVE mg/dL   Urobilinogen, UA 0.2  0.0 - 1.0 mg/dL   Nitrite NEGATIVE  NEGATIVE   Leukocytes, UA NEGATIVE  NEGATIVE  URINE MICROSCOPIC-ADD ON     Status: None   Collection Time    01/17/14  5:12 AM      Result Value Ref Range   Squamous Epithelial / LPF RARE  RARE   WBC, UA 0-2  <3 WBC/hpf   RBC / HPF 0-2  <3 RBC/hpf   Bacteria, UA RARE  RARE   Fetal Monitoring: Baseline: 150 bpm, moderate variability, + accelerations, few variable decelerations noted  Contractions: q 2-3 minutes  MAU Course  Procedures None  MDM Discussed with Dr. Philis Pique. CBC, CMP and UA today. She will come to MAU to evaluate the patient. Dr. Philis Pique in MAU for evaluation of patient.  Recommends [place] for presentation, BPP and AFI Breech presentation, BPP 4/8, non-reassuring fetal heart tracing Assessment  and Plan  A: Non-reassuring fetal tracing Breech  presentation Fever secondary to suspected chorioamnionitis  P: Dr. Philis Pique to take patient to OR for stat C/S  Farris Has, PA-C  01/17/2014, 6:08 AM

## 2014-01-17 NOTE — Addendum Note (Signed)
Addendum created 01/17/14 1634 by Garner Nash, CRNA   Modules edited: Charges VN, Notes Section   Notes Section:  File: [id number]

## 2014-01-17 NOTE — Op Note (Signed)
01/17/2014  7:36 AM  PATIENT:  Barbara Bryant  28 y.o. 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   Delay start of Pharmacological VTE agent (>24hrs) due to surgical blood loss or risk of bleeding: not applicable  Complications:  none Medications:  Ancef, Pitocin Findings:  Baby female, Apgars 2 at 1 minute- 5 minute reported by Neo to be "much better" but not calculated yet, weight P.   Cord gas 7.37.  Normal tubes, ovaries and uterus seen.  Appendix was located in situ and appeared normal without exudate or induration.  Tissues in area local to appendix were free of any signs of infection.  Baby went to NICU for obs. Technique:  After adequate spinal anesthesia was achieved, the patient was prepped and draped in usual sterile fashion.  A foley catheter was used to drain the bladder.  A pfannanstiel incision was made with the scalpel and carried down to the fascia with the bovie cautery. The fascia was incised in the midline with the scalpel and carried in a transverse curvilinear manner bilaterally.  The fascia was reflected superiorly and inferiorly off the rectus muscles and the muscles split in the midline.  A bowel free portion of the peritoneum was entered bluntly and then extended in a superior and inferior manner with good  visualization of the bowel and bladder.  The Alexis instrument was then placed and the vesico-uterine fascia tented up and incised in a transverse curvilinear manner.  A 2 cm transverse incision was made in the upper portion of the lower uterine segment until the amnion was exposed.   The incision was extended transversely in a blunt manner.  Clear fluid was noted and some was taken for culture; the baby delivered in the breech presentation without complication.  The cord was clamped and cut and baby was then handed to awaiting Neonatology.  The placenta was then delivered manually and the uterus cleared of all debris.  The uterine incision was then closed with a running lock stitch of 0 monocryl.  An imbricating layer of 0 monocryl was closed as well. Excellent hemostasis of the uterine incision was achieved and the abdomen was cleared with irrigation.  The peritoneal cavity was gingerly explored and there were no signs of any infection.The peritoneum was closed with a running stitch of 2-0 vicryl.  This incorporated the rectus muscles as a separate layer.  The fascia was then closed with a running stitch of 0 vicryl.  The subcutaneous layer was closed with interrupted  stitches of 2-0 plain gut.  The skin was closed with 4-0 vicryl on a Keith needle and steri-strips.  The patient tolerated the procedure well and was returned to the recovery room in stable condition.  All counts were correct times three.  Daria Pastures

## 2014-01-17 NOTE — Anesthesia Preprocedure Evaluation (Signed)
Anesthesia Evaluation  Patient identified by MRN, date of birth, ID band Patient awake    Reviewed: Allergy & Precautions, H&P , Patient's Chart, lab work & pertinent test results  Airway Mallampati: II TM Distance: >3 FB Neck ROM: full    Dental no notable dental hx.    Pulmonary  breath sounds clear to auscultation  Pulmonary exam normal       Cardiovascular Exercise Tolerance: Good Rhythm:regular Rate:Normal     Neuro/Psych    GI/Hepatic   Endo/Other    Renal/GU      Musculoskeletal   Abdominal   Peds  Hematology  (+) anemia ,   Anesthesia Other Findings   Reproductive/Obstetrics                           Anesthesia Physical Anesthesia Plan  ASA: III and emergent  Anesthesia Plan: Spinal   Post-op Pain Management:    Induction:   Airway Management Planned:   Additional Equipment:   Intra-op Plan:   Post-operative Plan:   Informed Consent: I have reviewed the patients History and Physical, chart, labs and discussed the procedure including the risks, benefits and alternatives for the proposed anesthesia with the patient or authorized representative who has indicated his/her understanding and acceptance.   Dental Advisory Given  Plan Discussed with: CRNA  Anesthesia Plan Comments: (Lab work confirmed with CRNA in room. Platelets okay. Discussed spinal anesthetic, and patient consents to the procedure:  included risk of possible headache,backache, failed block, allergic reaction, and nerve injury. This patient was asked if she had any questions or concerns before the procedure started. )        Anesthesia Quick Evaluation

## 2014-01-17 NOTE — Brief Op Note (Signed)
01/17/2014  7:36 AM  PATIENT:  Barbara Bryant  27 y.o. 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   Delay start of Pharmacological VTE agent (>24hrs) due to surgical blood loss or risk of bleeding: not applicable  Complications:  none Medications:  Ancef, Pitocin Findings:  Baby female, Apgars 2 at 1 minute- 5 minute reported by Neo to be "much better" but not calculated yet, weight P.   Cord gas 7.37.  Normal tubes, ovaries and uterus seen.  Appendix was located in situ and appeared normal without exudate or induration.  Tissues in area local to appendix were free of any signs of infection.  Baby went to NICU for obs. Technique:  After adequate spinal anesthesia was achieved, the patient was prepped and draped in usual sterile fashion.  A foley catheter was used to drain the bladder.  A pfannanstiel incision was made with the scalpel and carried down to the fascia with the bovie cautery. The fascia was incised in the midline with the scalpel and carried in a transverse curvilinear manner bilaterally.  The fascia was reflected superiorly and inferiorly off the rectus muscles and the muscles split in the midline.  A bowel free portion of the peritoneum was entered bluntly and then extended in a superior and inferior manner with good  visualization of the bowel and bladder.  The Alexis instrument was then placed and the vesico-uterine fascia tented up and incised in a transverse curvilinear manner.  A 2 cm transverse incision was made in the upper portion of the lower uterine segment until the amnion was exposed.   The incision was extended transversely in a blunt manner.  Clear fluid was noted and some was taken for culture; the baby delivered in the breech presentation without complication.  The cord was clamped and cut and baby was then handed to awaiting Neonatology.  The placenta was then delivered manually and the uterus cleared of all debris.  The uterine incision was then closed with a running lock stitch of 0 monocryl.  An imbricating layer of 0 monocryl was closed as well. Excellent hemostasis of the uterine incision was achieved and the abdomen was cleared with irrigation.  The peritoneal cavity was gingerly explored and there were no signs of any infection.The peritoneum was closed with a running stitch of 2-0 vicryl.  This incorporated the rectus muscles as a separate layer.  The fascia was then closed with a running stitch of 0 vicryl.  The subcutaneous layer was closed with interrupted  stitches of 2-0 plain gut.  The skin was closed with 4-0 vicryl on a Keith needle and steri-strips.  The patient tolerated the procedure well and was returned to the recovery room in stable condition.  All counts were correct times three.  Stephen Turnbaugh A Reyana Leisey   

## 2014-01-17 NOTE — Anesthesia Procedure Notes (Signed)

## 2014-01-18 LAB — CBC
HEMATOCRIT: 24.2 % — AB (ref 36.0–46.0)
HEMOGLOBIN: 7.7 g/dL — AB (ref 12.0–15.0)
MCH: 28.1 pg (ref 26.0–34.0)
MCHC: 31.8 g/dL (ref 30.0–36.0)
MCV: 88.3 fL (ref 78.0–100.0)
Platelets: 254 10*3/uL (ref 150–400)
RBC: 2.74 MIL/uL — AB (ref 3.87–5.11)
RDW: 14.4 % (ref 11.5–15.5)
WBC: 13.9 10*3/uL — AB (ref 4.0–10.5)

## 2014-01-18 MED ORDER — FAMOTIDINE 20 MG PO TABS
20.0000 mg | ORAL_TABLET | Freq: Two times a day (BID) | ORAL | Status: DC
  Administered 2014-01-18 – 2014-01-20 (×5): 20 mg via ORAL
  Filled 2014-01-18 (×5): qty 1

## 2014-01-18 MED ORDER — IBUPROFEN 100 MG/5ML PO SUSP
600.0000 mg | Freq: Four times a day (QID) | ORAL | Status: DC
  Administered 2014-01-18 – 2014-01-20 (×10): 600 mg via ORAL
  Filled 2014-01-18 (×12): qty 30

## 2014-01-18 MED ORDER — ACETAMINOPHEN 160 MG/5ML PO SOLN
325.0000 mg | ORAL | Status: DC | PRN
  Filled 2014-01-18: qty 20.3

## 2014-01-18 MED ORDER — COMPLETENATE 29-1 MG PO CHEW
1.0000 | CHEWABLE_TABLET | Freq: Every day | ORAL | Status: DC
  Administered 2014-01-18: 1 via ORAL
  Filled 2014-01-18 (×3): qty 1

## 2014-01-18 MED ORDER — OXYCODONE HCL 5 MG/5ML PO SOLN
5.0000 mg | ORAL | Status: DC | PRN
  Administered 2014-01-18 – 2014-01-20 (×4): 5 mg via ORAL
  Filled 2014-01-18 (×2): qty 5
  Filled 2014-01-18: qty 10
  Filled 2014-01-18: qty 5

## 2014-01-18 MED ORDER — FERROUS SULFATE 300 (60 FE) MG/5ML PO SYRP
300.0000 mg | ORAL_SOLUTION | Freq: Two times a day (BID) | ORAL | Status: DC
  Administered 2014-01-18 – 2014-01-20 (×5): 300 mg via ORAL
  Filled 2014-01-18 (×5): qty 5

## 2014-01-18 NOTE — Lactation Note (Signed)
This note was copied from the chart of Barbara Bryant. Lactation Consultation Note    Follow up consult with this mom and baby, in NICU, at baby's bedside while mom was breast feeding. Baby and mom now 28 hours post partum. Mom has easily expressed colsosum, and has been breast feeding baby, in NICU, on cue - baby's nusrs calls mom when baby ready to eat. i assisted mom with deeper latch. Baby with strong, rhythmic sucks and swallows(visible, not audible). Mom received her Medela DEP PIS today, and will call for me to show her how to use, later today.   Patient Name: Barbara Bryant Today's Date: 01/18/2014 Reason for consult: Follow-up assessment;NICU baby;Late preterm infant   Maternal Data Formula Feeding for Exclusion: Yes (baby in NICU) Infant to breast within first hour of birth: No Breastfeeding delayed due to:: Infant status Has patient been taught Hand Expression?: Yes Does the patient have breastfeeding experience prior to this delivery?: No  Feeding Feeding Type: Breast Fed  LATCH Score/Interventions Latch: Repeated attempts needed to sustain latch, nipple held in mouth throughout feeding, stimulation needed to elicit sucking reflex. Intervention(s): Adjust position;Assist with latch;Breast massage;Breast compression (mom using cradle hold with shallow latch. Adjusted her to cross cradle, with deeper latch, more comfortable)  Audible Swallowing: A few with stimulation Intervention(s): Skin to skin;Hand expression  Type of Nipple: Everted at rest and after stimulation (large, evert nipples, fills baby's mouth, but she suckles well )  Comfort (Breast/Nipple): Soft / non-tender     Hold (Positioning): Assistance needed to correctly position infant at breast and maintain latch. Intervention(s): Breastfeeding basics reviewed;Support Pillows;Position options;Skin to skin  LATCH Score: 7  Lactation Tools Discussed/Used Initiated by:: bedside rn Date initiated::  01/17/14   Consult Status Consult Status: Follow-up Date: 01/19/14 Follow-up type: In-patient    Tonna Corner 01/18/2014, 12:34 PM

## 2014-01-18 NOTE — Progress Notes (Signed)
  Patient is eating, ambulating, voiding.  Pain control is good.  Filed Vitals:   01/17/14 1845 01/17/14 2152 01/18/14 0137 01/18/14 0602  BP: 120/62 99/42 90/50  94/45  Pulse: 109 100 97 90  Temp: 98.1 F (36.7 C) 98.1 F (36.7 C) 97.8 F (36.6 C) 97.4 F (36.3 C)  TempSrc: Oral Oral Oral Oral  Resp: 20 18 18 18   Height:      Weight:      SpO2: 98% 100% 100% 99%    lungs:   clear to auscultation cor:    RRR Abdomen:  soft, appropriate tenderness, incisions intact and without erythema or exudate ex:    no cords   Lab Results  Component Value Date   WBC 13.9* 01/18/2014   HGB 7.7* 01/18/2014   HCT 24.2* 01/18/2014   MCV 88.3 01/18/2014   PLT 254 01/18/2014    --/--/A POS, A POS (05/23 0555)/RI  A/P    Post operative day 1.  Routine post op and postpartum care.  Expect d/c routing.  Percocet for pain control. Baby doing will in NICU.

## 2014-01-19 ENCOUNTER — Encounter (HOSPITAL_COMMUNITY): Payer: Self-pay | Admitting: Obstetrics and Gynecology

## 2014-01-19 NOTE — Lactation Note (Signed)
This note was copied from the chart of Barbara Bryant. Lactation Consultation Note  Patient Name: Barbara Bryant Today's Date: 01/19/2014 Reason for consult: Follow-up assessment;NICU baby;Infant < 6lbs;Late preterm infant  Infant in NICU in open crib.  Anticipated discharge for mom tomorrow.  Mom reports having Medela DEBP for discharge.  Mom reports pumping 3-4 times per day and only receiving drops of colostrum.  Encouraged mom to keep a pumping log and pump at least 8 times per day.  Educated on milk production & supply/ demand.  Encouraged mom to call if needed for further questions.  Consult Status Consult Status: Follow-up Date: 01/20/14 Follow-up type: In-patient    Barbara Bryant 01/19/2014, 4:21 PM

## 2014-01-19 NOTE — Progress Notes (Signed)
Patient is eating, ambulating, voiding.  Pain control is good. Appropriate lochia.  +flatus.  No complaints.  Baby doing well in NICU, may be out today.  Per pt, baby to stay until tomorrow.  Filed Vitals:   01/18/14 0602 01/18/14 1756 01/18/14 2108 01/19/14 0524  BP: 94/45 105/57 109/54 103/58  Pulse: 90 81 95 100  Temp: 97.4 F (36.3 C) 97.5 F (36.4 C) 98.1 F (36.7 C) 98 F (36.7 C)  TempSrc: Oral Oral Oral Oral  Resp: 18 18 18 18   Height:      Weight:      SpO2: 99% 100% 100% 100%    [employer]: c/d/i Ext: no CT  Lab Results  Component Value Date   WBC 13.9* 01/18/2014   HGB 7.7* 01/18/2014   HCT 24.2* 01/18/2014   MCV 88.3 01/18/2014   PLT 254 01/18/2014    --/--/A POS, A POS (05/23 0555)  A/P Post op day #2 s/p c/s for for preterm NRFHT 36.3.  Routine care.    Allyn Kenner

## 2014-01-19 NOTE — Clinical Social Work Maternal (Signed)
Clinical Social Work Department PSYCHOSOCIAL ASSESSMENT - MATERNAL/CHILD 01/19/2014  Patient:  Barbara Bryant, PASK  Account Number:  [id number]  [place] Date:  01/17/2014  Ardine Eng Name:   Kipp Brood    Clinical Social Worker:  Gerri Spore, LCSW   Date/Time:  01/19/2014 04:23 PM  Date Referred:  01/19/2014   Referral source  NICU     Referred reason  NICU   Other referral source:    I:  FAMILY / [place] legal guardian:  PARENT  Guardian - Name Guardian - Age Guardian - Address  Niger Allen 27 [address]; [place], [place] [address]  Falencio Thomspon 30    Other household support members/support persons Other support:   Royston Bake- Pts grandmother    II  PSYCHOSOCIAL DATA Information Source:  Patient Interview  Occupational hygienist Employment:   Conservator, museum/gallery resources:  Multimedia programmer If Agency:  [employer] / Grade:   Maternity Care Coordinator / Child Services Coordination / Early Interventions:  Cultural issues impacting care:    III  STRENGTHS Strengths  Adequate Resources  Home prepared for Child (including basic supplies)  Supportive family/friends   Strength comment:    IV  RISK FACTORS AND CURRENT PROBLEMS Current Problem:  None   Risk Factor & Current Problem Patient Issue Family Issue Risk Factor / Current Problem Comment   N N     V  SOCIAL WORK ASSESSMENT CSW met with MOB briefly in the unit to offer resources & assess her current social situation.  Pt lives alone.  She identified her grandmother as her primary support person. FOB is supportive & involved, per pt.  Pt verbalized an understanding of the reason for NICU admission.  Pt has reliable transportation to visit with the baby however she is hopeful that her daughter will discharge tomorrow.  She has all the necessary supplies for the infant & seems appropriate at this time.  CSW provided pt with a list of pediatricians &  encouraged her to select a provider prior to discharge.  CSW will continue to follow & assist family as needed.      VI SOCIAL WORK PLAN Social Work Plan  No Further Intervention Required / No Barriers to Discharge   Type of pt/family education:   If child protective services report - county:   If child protective services report - date:   Information/referral to community resources comment:   Other social work plan:

## 2014-01-19 NOTE — Progress Notes (Signed)
Ur chart review completed.  

## 2014-01-20 LAB — BODY FLUID CULTURE
CULTURE: NO GROWTH
SPECIAL REQUESTS: NORMAL

## 2014-01-20 LAB — TISSUE CULTURE
Culture: NO GROWTH
Gram Stain: NONE SEEN

## 2014-01-20 MED ORDER — DOCUSATE SODIUM 100 MG PO CAPS: 100.0000 mg | ORAL_CAPSULE | Freq: Two times a day (BID) | ORAL | Status: DC

## 2014-01-20 MED ORDER — IBUPROFEN 600 MG PO TABS: 600.0000 mg | ORAL_TABLET | Freq: Four times a day (QID) | ORAL | Status: DC | PRN

## 2014-01-20 MED ORDER — OXYCODONE-ACETAMINOPHEN 5-325 MG PO TABS: 2.0000 | ORAL_TABLET | ORAL | Status: DC | PRN

## 2014-01-20 NOTE — Lactation Note (Signed)
This note was copied from the chart of Barbara Bryant. Lactation Consultation Note     Follow up consult with this mom and baby, in NICU prednisone and post weight done - baby transferred about 5 mls at breast. She is mostly nipple sucking, mom's nipple pinched after feeding. Mom advised to pump every 2-3 hours today, to protect her supply and help with engogement,.  Mom also applying ice every 1-2 hours for 20 minutes. Mom to allow baby to breast feed about 15 minutes at a time, and then offer EBM and formula, as needed. Baby will be discharged to mom today, after car seat test and hearing screen. Mom encouraged to call for o/p lactation appointment, in a week or two. Mom has a Mudlogger EP pump at home to use.  Patient Name: Barbara Bryant Today's Date: 01/20/2014 Reason for consult: Follow-up assessment;NICU baby;Infant < 6lbs;Late preterm infant   Maternal Data    Feeding Feeding Type: Formula Length of feed: 30 min  LATCH Score/Interventions Latch: Grasps breast easily, tongue down, lips flanged, rhythmical sucking. (baby mostly nipple nipple sucking, due to mom's large nipple and baby's small size. Baby did transfer 5 mls at breast) Intervention(s): Adjust position;Assist with latch;Breast compression  Audible Swallowing: A few with stimulation  Type of Nipple: Everted at rest and after stimulation  Comfort (Breast/Nipple): Filling, red/small blisters or bruises, mild/mod discomfort Problem noted: Engorgment Intervention(s): Ice;Hand expression;Cabbage leaves;Other (comment) (massage with pumping, pump every 2-3 hours )  Problem noted: Filling;Mild/Moderate discomfort Interventions (Filling): Massage;Frequent nursing;Double electric pump  Hold (Positioning): Assistance needed to correctly position infant at breast and maintain latch. Intervention(s): Breastfeeding basics reviewed;Support Pillows;Position options;Skin to skin  LATCH Score: 7  Lactation Tools  Discussed/Used Pump Review: Setup, frequency, and cleaning (mom encouraghed to pump at least every 3 hours, in addition to breast feeding baby,)   Consult Status Consult Status: Follow-up Date: 01/20/14 Follow-up type: In-patient    Barbara Bryant 01/20/2014, 12:47 PM

## 2014-01-20 NOTE — Lactation Note (Signed)
This note was copied from the chart of Barbara Bryant. Lactation Consultation Note    Follow up consult with this mom of a NICU baby, now 37 hours post partum, and 371/7 weeks corrected gestation. Mom has been breast feeding Lyndia every 3 hours, around the clock, but has not been pumping. Today she is engorged, so i had her pump and apply ice. She expressed 20 mls of transitional milk. I will so a pre and post weight with mom and baby, with the next feed, and come up with a discharge feeding plan. Mom has been breast feeeding for 30-45 minutes, and then bottle feeding . I advised mom to limit the time at the breast to about 15-20 minutes, so that Alma Friendly still has energy to bottle feed and gain weight. Mom is also aware that she can come back for an outpatient lactation consult, and that I would recommend it, to assess how [place] is transferring, and advise mom on her frequency of pumping.   Patient Name: Barbara Bryant Today's Date: 01/20/2014 Reason for consult: Follow-up assessment;NICU baby;Infant < 6lbs;Late preterm infant   Maternal Data    Feeding Feeding Type: Breast Fed Length of feed: 20 min  LATCH Score/Interventions Latch: Grasps breast easily, tongue down, lips flanged, rhythmical sucking.  Audible Swallowing: Spontaneous and intermittent  Type of Nipple: Everted at rest and after stimulation  Comfort (Breast/Nipple): Filling, red/small blisters or bruises, mild/mod discomfort Problem noted: Engorgment Intervention(s): Ice;Hand expression;Cabbage leaves     Hold (Positioning): No assistance needed to correctly position infant at breast.  LATCH Score: 10  Lactation Tools Discussed/Used Pump Review: Setup, frequency, and cleaning (mom encouraghed to pump at least every 3 hours, in addition to breast feeding baby,)   Consult Status Consult Status: Follow-up Follow-up type: Call as needed    Tonna Corner 01/20/2014, 11:45 AM

## 2014-01-20 NOTE — Progress Notes (Signed)
Pt education complete. Pt understood all instructions and did not have any questions. Pt discharged home with family.

## 2014-01-20 NOTE — Discharge Summary (Signed)
Obstetric Discharge Summary Reason for Admission: Maternal Fever Prenatal Procedures: NST and ultrasound Intrapartum Procedures: cesarean: low cervical, transverse Postpartum Procedures: none Complications-Operative and Postpartum: none Hemoglobin  Date Value Ref Range Status  01/18/2014 7.7* 12.0 - 15.0 g/dL Final     HCT  Date Value Ref Range Status  01/18/2014 24.2* 36.0 - 46.0 % Final    Physical Exam:  General: alert, cooperative and appears stated age 28: appropriate Uterine Fundus: firm Incision: healing well DVT Evaluation: No evidence of DVT seen on physical exam.  Discharge Diagnoses: Preterm Delivery, Suspected Chorioamnionitis  Discharge Information: Date: 01/20/2014 Activity: pelvic rest Diet: routine Medications: Ibuprofen, Colace and Percocet Condition: improved Instructions: refer to practice specific booklet Discharge to: home Follow-up Information   Follow up with HORVATH,MICHELLE A, MD In 4 weeks. (For a post-partum evaluation)    Specialty:  Obstetrics and Gynecology   Contact information:   [place] [address] [phone]       Newborn Data: Live born female  Birth Weight: 5 lb 13.7 oz (2655 g) APGAR: 2, 7  Home with mother.  Farrel Gobble. Kemoni Quesenberry 01/20/2014, 9:00 AM

## 2014-03-02 ENCOUNTER — Telehealth: Payer: Self-pay | Admitting: Obstetrics and Gynecology

## 2014-03-02 NOTE — Telephone Encounter (Signed)
Mom is breast feeding her baby and would like to know if she can / should take her medication , so if you can give her a call back as soon as possible please. I didn't get a chance to get babys info but this is mom and her PCP Dr Dorothyann Peng

## 2014-03-02 NOTE — Telephone Encounter (Signed)
Message should have been opened in child's chart, not mother's.

## 2014-06-29 ENCOUNTER — Encounter (HOSPITAL_COMMUNITY): Payer: Self-pay | Admitting: Obstetrics and Gynecology

## 2014-08-23 ENCOUNTER — Emergency Department (HOSPITAL_BASED_OUTPATIENT_CLINIC_OR_DEPARTMENT_OTHER)
Admission: EM | Admit: 2014-08-23 | Discharge: 2014-08-23 | Disposition: A | Discharge status: Home / Self Care | Payer: 59 | Attending: Emergency Medicine | Admitting: Emergency Medicine

## 2014-08-23 ENCOUNTER — Encounter (HOSPITAL_BASED_OUTPATIENT_CLINIC_OR_DEPARTMENT_OTHER): Payer: Self-pay | Admitting: Emergency Medicine

## 2014-08-23 DIAGNOSIS — Z862 Personal history of diseases of the blood and blood-forming organs and certain disorders involving the immune mechanism: Secondary | ICD-10-CM | POA: Insufficient documentation

## 2014-08-23 DIAGNOSIS — M542 Cervicalgia: Secondary | ICD-10-CM | POA: Insufficient documentation

## 2014-08-23 DIAGNOSIS — Z79899 Other long term (current) drug therapy: Secondary | ICD-10-CM | POA: Insufficient documentation

## 2014-08-23 DIAGNOSIS — Z8742 Personal history of other diseases of the female genital tract: Secondary | ICD-10-CM | POA: Insufficient documentation

## 2014-08-23 DIAGNOSIS — H9203 Otalgia, bilateral: Secondary | ICD-10-CM | POA: Insufficient documentation

## 2014-08-23 DIAGNOSIS — Z91040 Latex allergy status: Secondary | ICD-10-CM | POA: Insufficient documentation

## 2014-08-23 DIAGNOSIS — J029 Acute pharyngitis, unspecified: Secondary | ICD-10-CM

## 2014-08-23 MED ORDER — ANTIPYRINE-BENZOCAINE 5.4-1.4 % OT SOLN: 3.0000 [drp] | OTIC | Status: DC | PRN

## 2014-08-23 NOTE — Discharge Instructions (Signed)
Take the prescribed medication as directed.  May also take over the counter tylenol or motrin. Follow-up with ENT if no improvement in the next few days or if symptoms worsen.

## 2014-08-23 NOTE — ED Notes (Signed)
Pt reports sore throat and ear/ neck pain onset yesterday denies ear drainage or cough

## 2014-08-23 NOTE — ED Provider Notes (Signed)
CSN: [id number]     Arrival date & time 08/23/14  2102 History   First MD Initiated Contact with Patient 08/23/14 2119     Chief Complaint  Patient presents with  . Ear Pain     (Consider location/radiation/quality/duration/timing/severity/associated sxs/prior Treatment) The history is provided by the patient and medical records.    This is a 28 year old female with past history significant for uterine fibroids, presenting to the ED for bilateral ear pain. She states a few days ago she felt as though there was something in her left ear, and she was vigorously scratching the inside her left ear. States since this time she has had bilateral ear pain, most notably when swallowing.  No ear drainage or bleeding. Denies changes in hearing. No fever or chills.  States somewhat of a sore throat developing today along with posterior neck pain.  She denies any cough, nasal congestion, difficulty swallowing, or headache.  No sick contacts.  Past Medical History  Diagnosis Date  . Abnormal Pap smear   . Anemia   . Fibroid   . Vaginal Pap smear, abnormal     has not had followup   Past Surgical History  Procedure Laterality Date  . Wisdom tooth extraction    . No past surgeries    . Cesarean section N/A 01/17/2014    Procedure: CESAREAN SECTION;  Surgeon: Daria Pastures, MD;  Location: [facility];  Service: Obstetrics;  Laterality: N/A;   Family History  Problem Relation Age of Onset  . Heart disease Maternal Grandmother   . Hearing loss Neg Hx    History  Substance Use Topics  . Smoking status: Never Smoker   . Smokeless tobacco: Never Used  . Alcohol Use: No   OB History    Gravida Para Term Preterm AB TAB SAB Ectopic Multiple Living   2 1  1 1  1   1      Review of Systems  HENT: Positive for ear pain.   All other systems reviewed and are negative.     Allergies  Latex  Home Medications   Prior to Admission medications   Medication Sig Start Date End Date Taking?  Authorizing Provider  docusate sodium (COLACE) 100 MG capsule Take 1 capsule (100 mg total) by mouth 2 (two) times daily. 01/20/14   Kendra H. Harrington Challenger, MD  ibuprofen (ADVIL,MOTRIN) 600 MG tablet Take 1 tablet (600 mg total) by mouth every 6 (six) hours as needed. 01/20/14   Kendra H. Harrington Challenger, MD  oxyCODONE-acetaminophen (ROXICET) 5-325 MG per tablet Take 2 tablets by mouth every 4 (four) hours as needed for severe pain. May take 1-2 tablets every 4-6 hours as needed for pain 01/20/14   Kendra H. Harrington Challenger, MD  Prenatal Vit-Fe Fumarate-FA (PRENATAL MULTIVITAMIN) TABS tablet Take 1 tablet by mouth daily at 12 noon.    Historical Provider, MD   BP 127/73 mmHg  Pulse 89  Temp(Src) 97.8 F (36.6 C) (Oral)  Resp 16  SpO2 100%  LMP 07/26/2014   Physical Exam  Constitutional: She is oriented to person, place, and time. She appears well-developed and well-nourished.  Talking on cell phone held to left ear  HENT:  Head: Normocephalic and atraumatic.  Right Ear: Hearing, tympanic membrane and ear canal normal. No mastoid tenderness.  Left Ear: Hearing, tympanic membrane and ear canal normal. No mastoid tenderness.  Nose: Nose normal.  Mouth/Throat: Uvula is midline, oropharynx is clear and moist and mucous membranes are normal. No oropharyngeal exudate, posterior  oropharyngeal edema, posterior oropharyngeal erythema or tonsillar abscesses.  Eyes: Conjunctivae and EOM are normal. Pupils are equal, round, and reactive to light.  Neck: Trachea normal, normal range of motion, full passive range of motion without pain and phonation normal. Neck supple. No spinous process tenderness and no muscular tenderness present. No rigidity.  No meningismus  Cardiovascular: Normal rate, regular rhythm and normal heart sounds.   Pulmonary/Chest: Effort normal and breath sounds normal.  Abdominal: Soft. Bowel sounds are normal.  Musculoskeletal: Normal range of motion.  Neurological: She is alert and oriented to person, place,  and time.  Skin: Skin is warm and dry. No rash noted.  Psychiatric: She has a normal mood and affect.  Nursing note and vitals reviewed.   ED Course  Procedures (including critical care time) Labs Review Labs Reviewed - No data to display  Imaging Review No results found.   EKG Interpretation None      MDM   Final diagnoses:  Ear pain, bilateral  Sore throat  Neck pain   28 year old female with bilateral ear pain after scratching the inside of her left ear, pain worse with swallowing. She also notes a mild sore throat and posterior neck pain that began today. On exam, patient afebrile and nontoxic in appearance. Her ears are normal in appearance-- no signs of internal abrasion, TM perforation, infection, or mastoid tenderness. Oropharynx without signs of strep throat. Full range of motion of neck, no rigidity to suggest meningitis. Patient was started on auralgan drops for comfort, given ENT follow-up.  Discussed plan with patient, he/she acknowledged understanding and agreed with plan of care.  Return precautions given for new or worsening symptoms.  Larene Pickett, PA-C 08/23/14 4801  Debby Freiberg, MD 08/29/14 1310

## 2015-03-17 ENCOUNTER — Inpatient Hospital Stay (HOSPITAL_COMMUNITY)
Admission: AD | Admit: 2015-03-17 | Discharge: 2015-03-17 | Disposition: A | Discharge status: Home / Self Care | Payer: Medicaid Other | Source: Ambulatory Visit | Attending: Obstetrics and Gynecology | Admitting: Obstetrics and Gynecology

## 2015-03-17 ENCOUNTER — Encounter (HOSPITAL_COMMUNITY): Payer: Self-pay

## 2015-03-17 DIAGNOSIS — O9989 Other specified diseases and conditions complicating pregnancy, childbirth and the puerperium: Secondary | ICD-10-CM | POA: Insufficient documentation

## 2015-03-17 DIAGNOSIS — A084 Viral intestinal infection, unspecified: Secondary | ICD-10-CM | POA: Diagnosis not present

## 2015-03-17 DIAGNOSIS — A059 Bacterial foodborne intoxication, unspecified: Secondary | ICD-10-CM | POA: Insufficient documentation

## 2015-03-17 DIAGNOSIS — Z3A23 23 weeks gestation of pregnancy: Secondary | ICD-10-CM | POA: Diagnosis not present

## 2015-03-17 DIAGNOSIS — O212 Late vomiting of pregnancy: Secondary | ICD-10-CM | POA: Diagnosis present

## 2015-03-17 LAB — COMPREHENSIVE METABOLIC PANEL
ALBUMIN: 3.2 g/dL — AB (ref 3.5–5.0)
ALK PHOS: 41 U/L (ref 38–126)
ALT: 8 U/L — AB (ref 14–54)
AST: 14 U/L — ABNORMAL LOW (ref 15–41)
Anion gap: 4 — ABNORMAL LOW (ref 5–15)
BILIRUBIN TOTAL: 0.4 mg/dL (ref 0.3–1.2)
BUN: 14 mg/dL (ref 6–20)
CO2: 22 mmol/L (ref 22–32)
Calcium: 8.9 mg/dL (ref 8.9–10.3)
Chloride: 109 mmol/L (ref 101–111)
Creatinine, Ser: 0.61 mg/dL (ref 0.44–1.00)
Glucose, Bld: 88 mg/dL (ref 65–99)
POTASSIUM: 4 mmol/L (ref 3.5–5.1)
SODIUM: 135 mmol/L (ref 135–145)
Total Protein: 6.6 g/dL (ref 6.5–8.1)

## 2015-03-17 LAB — CBC WITH DIFFERENTIAL/PLATELET
Basophils Absolute: 0 10*3/uL (ref 0.0–0.1)
Basophils Relative: 0 % (ref 0–1)
EOS ABS: 0.1 10*3/uL (ref 0.0–0.7)
Eosinophils Relative: 1 % (ref 0–5)
HEMATOCRIT: 30.2 % — AB (ref 36.0–46.0)
Hemoglobin: 10.1 g/dL — ABNORMAL LOW (ref 12.0–15.0)
LYMPHS ABS: 2 10*3/uL (ref 0.7–4.0)
LYMPHS PCT: 23 % (ref 12–46)
MCH: 30.1 pg (ref 26.0–34.0)
MCHC: 33.4 g/dL (ref 30.0–36.0)
MCV: 89.9 fL (ref 78.0–100.0)
Monocytes Absolute: 0.9 10*3/uL (ref 0.1–1.0)
Monocytes Relative: 10 % (ref 3–12)
NEUTROS ABS: 5.6 10*3/uL (ref 1.7–7.7)
Neutrophils Relative %: 66 % (ref 43–77)
PLATELETS: 251 10*3/uL (ref 150–400)
RBC: 3.36 MIL/uL — ABNORMAL LOW (ref 3.87–5.11)
RDW: 13.9 % (ref 11.5–15.5)
WBC: 8.5 10*3/uL (ref 4.0–10.5)

## 2015-03-17 MED ORDER — PROMETHAZINE HCL 12.5 MG PO TABS: 12.5000 mg | ORAL_TABLET | Freq: Four times a day (QID) | ORAL | Status: DC | PRN

## 2015-03-17 MED ORDER — PROMETHAZINE HCL 25 MG/ML IJ SOLN
25.0000 mg | Freq: Once | INTRAVENOUS | Status: AC
  Administered 2015-03-17: 25 mg via INTRAVENOUS
  Filled 2015-03-17: qty 1

## 2015-03-17 MED ORDER — ONDANSETRON HCL 4 MG PO TABS: 4.0000 mg | ORAL_TABLET | Freq: Four times a day (QID) | ORAL | Status: DC

## 2015-03-17 NOTE — MAU Note (Addendum)
Pt c/o abdominal cramping that began tonight. Woke up with n/v/d tonight. Denies being around anyone sick. Thinks she may have food poisoning. Denies fever/chills. No LOF or vag bleeding.

## 2015-03-17 NOTE — Discharge Instructions (Signed)

## 2015-03-17 NOTE — MAU Provider Note (Signed)
History     CSN: [id number]  Arrival date and time: 03/17/15 4462   First Provider Initiated Contact with Patient 03/17/15 [phone]      Chief Complaint  Patient presents with  . Morning Sickness  . Emesis During Pregnancy  . Diarrhea  . Abdominal Cramping   HPI  Ms. Niger Allen is a 29 y.o. [phone] at [redacted]w[redacted]d who presents to MAU today with complaint of N/V/D since earlier tonight. She states that she ate ice cream earlier tonight that she thinks has made her sick. She had cramping in her abdomen earlier and then woke up with diarrhea. She than developed N/V. She is still having some mild cramping. She denies contractions, vaginal bleeding, LOF, fever or known sick contacts. She reports good fetal movement.    OB History    Gravida Para Term Preterm AB TAB SAB Ectopic Multiple Living   3 1  1 1  1   1       Past Medical History  Diagnosis Date  . Abnormal Pap smear   . Anemia   . Fibroid   . Vaginal Pap smear, abnormal     has not had followup    Past Surgical History  Procedure Laterality Date  . Wisdom tooth extraction    . No past surgeries    . Cesarean section N/A 01/17/2014    Procedure: CESAREAN SECTION;  Surgeon: Daria Pastures, MD;  Location: [facility];  Service: Obstetrics;  Laterality: N/A;    Family History  Problem Relation Age of Onset  . Heart disease Maternal Grandmother   . Hearing loss Neg Hx     History  Substance Use Topics  . Smoking status: Never Smoker   . Smokeless tobacco: Never Used  . Alcohol Use: No    Allergies:  Allergies  Allergen Reactions  . Latex Other (See Comments)    Irritation with condoms    Prescriptions prior to admission  Medication Sig Dispense Refill Last Dose  . Prenatal Vit-Fe Fumarate-FA (PRENATAL MULTIVITAMIN) TABS tablet Take 1 tablet by mouth daily at 12 noon.   03/16/2015 at Unknown time  . antipyrine-benzocaine (AURALGAN) otic solution Place 3-4 drops into both ears every 2 (two) hours as needed for ear  pain. 10 mL 0 More than a month at Unknown time  . docusate sodium (COLACE) 100 MG capsule Take 1 capsule (100 mg total) by mouth 2 (two) times daily. 60 capsule 0 More than a month at Unknown time  . ibuprofen (ADVIL,MOTRIN) 600 MG tablet Take 1 tablet (600 mg total) by mouth every 6 (six) hours as needed. 90 tablet 0 More than a month at Unknown time  . oxyCODONE-acetaminophen (ROXICET) 5-325 MG per tablet Take 2 tablets by mouth every 4 (four) hours as needed for severe pain. May take 1-2 tablets every 4-6 hours as needed for pain 46 tablet 0 More than a month at Unknown time    Review of Systems  Constitutional: Negative for fever and malaise/fatigue.  Gastrointestinal: Positive for nausea, vomiting, abdominal pain and diarrhea. Negative for constipation.  Genitourinary:       Neg - vaginal bleeding, discharge, LOF   Physical Exam   Blood pressure 116/68, pulse 100, temperature 98.4 F (36.9 C), temperature source Oral, resp. rate 18, last menstrual period 07/26/2014, unknown if currently breastfeeding.  Physical Exam  Nursing note and vitals reviewed. Constitutional: She is oriented to person, place, and time. She appears well-developed and well-nourished. No distress.  HENT:  Head: Normocephalic  and atraumatic.  Cardiovascular: Normal rate.   Respiratory: Effort normal.  GI: Soft. She exhibits no distension and no mass. There is no tenderness. There is no rebound and no guarding.  Neurological: She is alert and oriented to person, place, and time.  Skin: Skin is warm and dry. No erythema.  Psychiatric: She has a normal mood and affect.   Results for orders placed or performed during the hospital encounter of 03/17/15 (from the past 24 hour(s))  CBC with Differential/Platelet     Status: Abnormal   Collection Time: 03/17/15  3:40 AM  Result Value Ref Range   WBC 8.5 4.0 - 10.5 K/uL   RBC 3.36 (L) 3.87 - 5.11 MIL/uL   Hemoglobin 10.1 (L) 12.0 - 15.0 g/dL   HCT 30.2 (L) 36.0 -  46.0 %   MCV 89.9 78.0 - 100.0 fL   MCH 30.1 26.0 - 34.0 pg   MCHC 33.4 30.0 - 36.0 g/dL   RDW 13.9 11.5 - 15.5 %   Platelets 251 150 - 400 K/uL   Neutrophils Relative % 66 43 - 77 %   Neutro Abs 5.6 1.7 - 7.7 K/uL   Lymphocytes Relative 23 12 - 46 %   Lymphs Abs 2.0 0.7 - 4.0 K/uL   Monocytes Relative 10 3 - 12 %   Monocytes Absolute 0.9 0.1 - 1.0 K/uL   Eosinophils Relative 1 0 - 5 %   Eosinophils Absolute 0.1 0.0 - 0.7 K/uL   Basophils Relative 0 0 - 1 %   Basophils Absolute 0.0 0.0 - 0.1 K/uL  Comprehensive metabolic panel     Status: Abnormal   Collection Time: 03/17/15  3:40 AM  Result Value Ref Range   Sodium 135 135 - 145 mmol/L   Potassium 4.0 3.5 - 5.1 mmol/L   Chloride 109 101 - 111 mmol/L   CO2 22 22 - 32 mmol/L   Glucose, Bld 88 65 - 99 mg/dL   BUN 14 6 - 20 mg/dL   Creatinine, Ser 0.61 0.44 - 1.00 mg/dL   Calcium 8.9 8.9 - 10.3 mg/dL   Total Protein 6.6 6.5 - 8.1 g/dL   Albumin 3.2 (L) 3.5 - 5.0 g/dL   AST 14 (L) 15 - 41 U/L   ALT 8 (L) 14 - 54 U/L   Alkaline Phosphatase 41 38 - 126 U/L   Total Bilirubin 0.4 0.3 - 1.2 mg/dL   GFR calc non Af Amer >60 >60 mL/min   GFR calc Af Amer >60 >60 mL/min   Anion gap 4 (L) 5 - 15     Fetal Monitoring: Baseline: 120 bpm, moderate variability, + accelerations, no decelerations (some areas of maternal HR noted with patient change of positions) Contractions: none  MAU Course  Procedures None  MDM UA, CBC and CMP ordered Patient unable to given urine sample upon arrival in MAU Discussed patient with Dr. Philis Pique. Agrees with plan for IV hydration and anti-emetics. Ok with discharge after IV hydration and Rx for anti-emetics Patient given 1 liter IV LR with 25 mg Phenergan. CBC and CMP without significant abnormalities. Patient still does not want to give urine sample, but states that this is because she does not want to have diarrhea again.  Offered 2nd liter of IV hydration. Patient declines. Would like to go home and  PO hydrate with anti-emetics. Patient states continued abdominal cramping, but denies contractions. No contractions noted on EFM while in MAU.  Assessment and Plan  A: [facility] at [redacted]w[redacted]d  Viral gastroenteritis vs food poisoning  P: Discharge home Rx for Phenergan and Zofran given to patient Increased PO hydration and advance diet as tolerated Patient advised to follow-up with [facility] as scheduled or sooner PRN Patient may return to MAU as needed or if her condition were to change or worsen  Luvenia Redden, PA-C  03/17/2015, 4:54 AM

## 2015-03-23 ENCOUNTER — Other Ambulatory Visit: Payer: Self-pay | Admitting: Obstetrics & Gynecology

## 2015-04-09 ENCOUNTER — Other Ambulatory Visit: Payer: Self-pay | Admitting: Obstetrics and Gynecology

## 2015-06-01 ENCOUNTER — Other Ambulatory Visit: Payer: Self-pay | Admitting: Obstetrics and Gynecology

## 2015-06-16 ENCOUNTER — Other Ambulatory Visit: Payer: Self-pay | Admitting: Obstetrics and Gynecology

## 2015-06-17 ENCOUNTER — Inpatient Hospital Stay (HOSPITAL_COMMUNITY)
Admission: AD | Admit: 2015-06-17 | Discharge: 2015-06-17 | Disposition: A | Discharge status: Home / Self Care | Payer: Medicaid Other | Source: Ambulatory Visit | Attending: Obstetrics | Admitting: Obstetrics

## 2015-06-17 ENCOUNTER — Encounter (HOSPITAL_COMMUNITY): Payer: Self-pay | Admitting: *Deleted

## 2015-06-17 DIAGNOSIS — Z3A36 36 weeks gestation of pregnancy: Secondary | ICD-10-CM | POA: Diagnosis not present

## 2015-06-17 DIAGNOSIS — O26853 Spotting complicating pregnancy, third trimester: Secondary | ICD-10-CM | POA: Diagnosis not present

## 2015-06-17 DIAGNOSIS — O4693 Antepartum hemorrhage, unspecified, third trimester: Secondary | ICD-10-CM | POA: Diagnosis present

## 2015-06-17 NOTE — Discharge Instructions (Signed)
Vaginal Bleeding During Pregnancy, Third Trimester  A small amount of bleeding (spotting) from the vagina is common in pregnancy. Sometimes the bleeding is normal and is not a problem, and sometimes it is a sign of something serious. Be sure to tell your doctor about any bleeding from your vagina right away. HOME CARE  Watch your condition for any changes.  Follow your doctor's instructions about how active you can be.  If you are on bed rest:  You may need to stay in bed and only get up to use the bathroom.  You may be allowed to do some activities.  If you need help, make plans for someone to help you.  Write down:  The number of pads you use each day.  How often you change pads.  How soaked (saturated) your pads are.  Do not use tampons.  Do not douche.  Do not have sex or orgasms until your doctor says it is okay.  Follow your doctor's advice about lifting, driving, and doing physical activities.  If you pass any tissue from your vagina, save the tissue so you can show it to your doctor.  Only take medicines as told by your doctor.  Do not take aspirin because it can make you bleed.  Keep all follow-up visits as told by your doctor. GET HELP IF:   You bleed from your vagina.  You have cramps.  You have labor pains.  You have a fever that does not go away after you take medicine. GET HELP RIGHT AWAY IF:  You have very bad cramps in your back or belly (abdomen).  You have chills.  You have a gush of fluid from your vagina.  You pass large clots or tissue from your vagina.  You bleed more.  You feel light-headed or weak.  You pass out (faint).  You do not feel your baby move around as much as before. MAKE SURE YOU:  Understand these instructions.  Will watch your condition.  Will get help right away if you are not doing well or get worse.   This information is not intended to replace advice given to you by your health care provider. Make sure  you discuss any questions you have with your health care provider.   Document Released: 12/29/2013 Document Reviewed: 12/29/2013 Elsevier Interactive Patient Education 2016 Else[employer] of the uterus can occur throughout pregnancy. Contractions are not always a sign that you are in labor.  WHAT ARE BRAXTON HICKS CONTRACTIONS?  Contractions that occur before labor are called Braxton Hicks contractions, or false labor. Toward the end of pregnancy (32-34 weeks), these contractions can develop more often and may become more forceful. This is not true labor because these contractions do not result in opening (dilatation) and thinning of the cervix. They are sometimes difficult to tell apart from true labor because these contractions can be forceful and people have different pain tolerances. You should not feel embarrassed if you go to the hospital with false labor. Sometimes, the only way to tell if you are in true labor is for your health care provider to look for changes in the cervix. If there are no prenatal problems or other health problems associated with the pregnancy, it is completely safe to be sent home with false labor and await the onset of true labor. HOW CAN YOU TELL THE DIFFERENCE BETWEEN TRUE AND FALSE LABOR? False Labor  The contractions of false labor are usually shorter and not as  hard as those of true labor.   The contractions are usually irregular.   The contractions are often felt in the front of the lower abdomen and in the groin.   The contractions may go away when you walk around or change positions while lying down.   The contractions get weaker and are shorter lasting as time goes on.   The contractions do not usually become progressively stronger, regular, and closer together as with true labor.  True Labor  Contractions in true labor last 30-70 seconds, become very regular, usually become more intense, and increase in  frequency.   The contractions do not go away with walking.   The discomfort is usually felt in the top of the uterus and spreads to the lower abdomen and low back.   True labor can be determined by your health care provider with an exam. This will show that the cervix is dilating and getting thinner.  WHAT TO REMEMBER  Keep up with your usual exercises and follow other instructions given by your health care provider.   Take medicines as directed by your health care provider.   Keep your regular prenatal appointments.   Eat and drink lightly if you think you are going into labor.   If Braxton Hicks contractions are making you uncomfortable:   Change your position from lying down or resting to walking, or from walking to resting.   Sit and rest in a tub of warm water.   Drink 2-3 glasses of water. Dehydration may cause these contractions.   Do slow and deep breathing several times an hour.  WHEN SHOULD I SEEK IMMEDIATE MEDICAL CARE? Seek immediate medical care if:  Your contractions become stronger, more regular, and closer together.   You have fluid leaking or gushing from your vagina.   You have a fever.   You pass blood-tinged mucus.   You have vaginal bleeding.   You have continuous abdominal pain.   You have low back pain that you never had before.   You feel your baby's head pushing down and causing pelvic pressure.   Your baby is not moving as much as it used to.    This information is not intended to replace advice given to you by your health care provider. Make sure you discuss any questions you have with your health care provider.   Document Released: 08/14/2005 Document Revised: 08/19/2013 Document Reviewed: 05/26/2013 Elsevier Interactive Patient Education [employer].

## 2015-06-17 NOTE — MAU Note (Addendum)
Pt stated she had some bleeding in her panties and when she wiped. Denies pain or cramping. Did have a doctor appointment yesterday and cervix was checked. Not dialated. Called MD today and was told to come in to be checked out,.

## 2015-06-17 NOTE — MAU Provider Note (Signed)
History     CSN: [id number]  Arrival date and time: 06/17/15 1527   First Provider Initiated Contact with Patient 06/17/15 1607      Chief Complaint  Patient presents with  . Vaginal Bleeding   HPI Ms. Barbara Bryant is a 29 y.o. [id number] at [redacted]w[redacted]d who presents to MAU today with complaint of vaginal bleeding. The patient states that she noted a scant amount of dark blood with wiping this afternoon. She states that she had her cervix checked in the office this week. She denies recent intercourse. She denies abdominal, contractions or LOF. She also denies complications with the pregnancy.   OB History    Gravida Para Term Preterm AB TAB SAB Ectopic Multiple Living   3 1  1 1  1   1       Past Medical History  Diagnosis Date  . Abnormal Pap smear   . Anemia   . Fibroid   . Vaginal Pap smear, abnormal     has not had followup    Past Surgical History  Procedure Laterality Date  . Wisdom tooth extraction    . No past surgeries    . Cesarean section N/A 01/17/2014    Procedure: CESAREAN SECTION;  Surgeon: Daria Pastures, MD;  Location: [facility];  Service: Obstetrics;  Laterality: N/A;    Family History  Problem Relation Age of Onset  . Heart disease Maternal Grandmother   . Hearing loss Neg Hx     Social History  Substance Use Topics  . Smoking status: Never Smoker   . Smokeless tobacco: Never Used  . Alcohol Use: No    Allergies:  Allergies  Allergen Reactions  . Latex Other (See Comments)    Irritation with condoms    Prescriptions prior to admission  Medication Sig Dispense Refill Last Dose  . antipyrine-benzocaine (AURALGAN) otic solution Place 3-4 drops into both ears every 2 (two) hours as needed for ear pain. 10 mL 0 More than a month at Unknown time  . ondansetron (ZOFRAN) 4 MG tablet Take 1 tablet (4 mg total) by mouth every 6 (six) hours. 12 tablet 0   . oxyCODONE-acetaminophen (ROXICET) 5-325 MG per tablet Take 2 tablets by mouth every 4 (four) hours  as needed for severe pain. May take 1-2 tablets every 4-6 hours as needed for pain 46 tablet 0 More than a month at Unknown time  . Prenatal Vit-Fe Fumarate-FA (PRENATAL MULTIVITAMIN) TABS tablet Take 1 tablet by mouth daily at 12 noon.   03/16/2015 at Unknown time  . promethazine (PHENERGAN) 12.5 MG tablet Take 1 tablet (12.5 mg total) by mouth every 6 (six) hours as needed for nausea or vomiting. 30 tablet 0     Review of Systems  Constitutional: Negative for fever and malaise/fatigue.  Gastrointestinal: Negative for abdominal pain.  Genitourinary:       Neg - vaginal discharge, LOF + vaginal bleeding   Physical Exam   Blood pressure 124/67, pulse 79, temperature 98.6 F (37 C), resp. rate 18, height 5\' 4"  (1.626 m), weight 195 lb 6.4 oz (88.633 kg), last menstrual period 07/26/2014, unknown if currently breastfeeding.  Physical Exam  Nursing note and vitals reviewed. Constitutional: She is oriented to person, place, and time. She appears well-developed and well-nourished. No distress.  HENT:  Head: Normocephalic and atraumatic.  Cardiovascular: Normal rate.   Respiratory: Effort normal.  GI: Soft.  Genitourinary: Uterus is enlarged (appropriate for GA). Cervix exhibits no motion tenderness and no friability.  There is bleeding (scant light brown tinge in discharge) in the vagina. Vaginal discharge (scant thick, white discharge noted) found.  Neurological: She is alert and oriented to person, place, and time.  Skin: Skin is warm and dry. No erythema.  Psychiatric: She has a normal mood and affect.  Dilation: Closed Effacement (%): Thick Exam by:: Tomi Bamberger PA  Fetal Monitoring: Baseline: 120 bpm, moderate variability, + accelerations, no decelerations Contractions: none  MAU Course  Procedures None  MDM Discussed patient with Dr. Carlis Abbott. Agrees with plan for discharge with re-assurance. She has reviewed EFM remotely. Patient may follow-up as scheduled in the office.    Assessment and Plan  A: [facility] at [redacted]w[redacted]d Spotting in pregnancy, third trimester  P: Discharge home Bleeding/labor precautions discussed Patient advised to follow-[facility] with [facility] as scheduled for routine prenatal care or sooner PRN Patient may return to MAU as needed or if her condition were to change or worsen  Luvenia Redden, PA-C  06/17/2015, 4:24 PM

## 2015-07-16 ENCOUNTER — Other Ambulatory Visit: Payer: Self-pay | Admitting: Obstetrics and Gynecology

## 2015-07-19 NOTE — Anesthesia Preprocedure Evaluation (Addendum)
Anesthesia Evaluation  Patient identified by MRN, date of birth, ID band Patient awake    Reviewed: Allergy & Precautions, NPO status , Patient's Chart, lab work & pertinent test results  History of Anesthesia Complications Negative for: history of anesthetic complications  Airway Mallampati: II  TM Distance: >3 FB Neck ROM: Full    Dental no notable dental hx. (+) Dental Advisory Given   Pulmonary neg pulmonary ROS,    Pulmonary exam normal breath sounds clear to auscultation       Cardiovascular negative cardio ROS Normal cardiovascular exam Rhythm:Regular Rate:Normal     Neuro/Psych negative neurological ROS  negative psych ROS   GI/Hepatic negative GI ROS, Neg liver ROS,   Endo/Other  obesity  Renal/GU negative Renal ROS  negative genitourinary   Musculoskeletal negative musculoskeletal ROS (+)   Abdominal   Peds negative pediatric ROS (+)  Hematology negative hematology ROS (+)   Anesthesia Other Findings   Reproductive/Obstetrics (+) Pregnancy                            Anesthesia Physical Anesthesia Plan  ASA: II  Anesthesia Plan: Spinal   Post-op Pain Management:    Induction:   Airway Management Planned:   Additional Equipment:   Intra-op Plan:   Post-operative Plan:   Informed Consent: I have reviewed the patients History and Physical, chart, labs and discussed the procedure including the risks, benefits and alternatives for the proposed anesthesia with the patient or authorized representative who has indicated his/her understanding and acceptance.   Dental advisory given  Plan Discussed with: CRNA  Anesthesia Plan Comments:         Anesthesia Quick Evaluation

## 2015-07-20 ENCOUNTER — Inpatient Hospital Stay (HOSPITAL_COMMUNITY): Payer: Medicaid Other | Admitting: Anesthesiology

## 2015-07-20 ENCOUNTER — Inpatient Hospital Stay (HOSPITAL_COMMUNITY)
Admission: RE | Admit: 2015-07-20 | Discharge: 2015-07-22 | DRG: 766 | Disposition: A | Discharge status: Home / Self Care | Payer: Medicaid Other | Source: Ambulatory Visit | Attending: Obstetrics and Gynecology | Admitting: Obstetrics and Gynecology

## 2015-07-20 ENCOUNTER — Encounter (HOSPITAL_COMMUNITY): Payer: Self-pay | Admitting: *Deleted

## 2015-07-20 ENCOUNTER — Encounter (HOSPITAL_COMMUNITY)
Admission: RE | Disposition: A | Discharge status: Home / Self Care | Payer: Self-pay | Source: Ambulatory Visit | Attending: Obstetrics and Gynecology

## 2015-07-20 DIAGNOSIS — Z6832 Body mass index (BMI) 32.0-32.9, adult: Secondary | ICD-10-CM | POA: Diagnosis not present

## 2015-07-20 DIAGNOSIS — Z3A41 41 weeks gestation of pregnancy: Secondary | ICD-10-CM | POA: Diagnosis not present

## 2015-07-20 DIAGNOSIS — O99824 Streptococcus B carrier state complicating childbirth: Secondary | ICD-10-CM | POA: Diagnosis present

## 2015-07-20 DIAGNOSIS — O34211 Maternal care for low transverse scar from previous cesarean delivery: Secondary | ICD-10-CM | POA: Diagnosis present

## 2015-07-20 DIAGNOSIS — O329XX0 Maternal care for malpresentation of fetus, unspecified, not applicable or unspecified: Secondary | ICD-10-CM | POA: Diagnosis present

## 2015-07-20 DIAGNOSIS — Z8249 Family history of ischemic heart disease and other diseases of the circulatory system: Secondary | ICD-10-CM | POA: Diagnosis not present

## 2015-07-20 DIAGNOSIS — O3413 Maternal care for benign tumor of corpus uteri, third trimester: Secondary | ICD-10-CM | POA: Diagnosis present

## 2015-07-20 DIAGNOSIS — O480 Post-term pregnancy: Secondary | ICD-10-CM | POA: Diagnosis present

## 2015-07-20 DIAGNOSIS — O9902 Anemia complicating childbirth: Secondary | ICD-10-CM | POA: Diagnosis present

## 2015-07-20 DIAGNOSIS — O99214 Obesity complicating childbirth: Secondary | ICD-10-CM | POA: Diagnosis present

## 2015-07-20 DIAGNOSIS — E669 Obesity, unspecified: Secondary | ICD-10-CM | POA: Diagnosis present

## 2015-07-20 DIAGNOSIS — Z9889 Other specified postprocedural states: Secondary | ICD-10-CM

## 2015-07-20 LAB — CBC
HEMATOCRIT: 28.4 % — AB (ref 36.0–46.0)
Hemoglobin: 8.8 g/dL — ABNORMAL LOW (ref 12.0–15.0)
MCH: 25.3 pg — AB (ref 26.0–34.0)
MCHC: 31 g/dL (ref 30.0–36.0)
MCV: 81.6 fL (ref 78.0–100.0)
PLATELETS: 319 10*3/uL (ref 150–400)
RBC: 3.48 MIL/uL — AB (ref 3.87–5.11)
RDW: 15.2 % (ref 11.5–15.5)
WBC: 7.7 10*3/uL (ref 4.0–10.5)

## 2015-07-20 LAB — TYPE AND SCREEN
ABO/RH(D): A POS
Antibody Screen: NEGATIVE

## 2015-07-20 LAB — RPR: RPR Ser Ql: NONREACTIVE

## 2015-07-20 SURGERY — Surgical Case
Anesthesia: Spinal

## 2015-07-20 MED ORDER — MENTHOL 3 MG MT LOZG: 1.0000 | LOZENGE | OROMUCOSAL | Status: DC | PRN

## 2015-07-20 MED ORDER — NALOXONE HCL 2 MG/2ML IJ SOSY
1.0000 ug/kg/h | PREFILLED_SYRINGE | INTRAVENOUS | Status: DC | PRN
  Filled 2015-07-20: qty 2

## 2015-07-20 MED ORDER — NALBUPHINE HCL 10 MG/ML IJ SOLN: 5.0000 mg | Freq: Once | INTRAMUSCULAR | Status: DC | PRN

## 2015-07-20 MED ORDER — DIPHENHYDRAMINE HCL 50 MG/ML IJ SOLN: 12.5000 mg | INTRAMUSCULAR | Status: DC | PRN

## 2015-07-20 MED ORDER — GLYCOPYRROLATE 0.2 MG/ML IJ SOLN
INTRAMUSCULAR | Status: DC | PRN
  Administered 2015-07-20: 0.2 mg via INTRAVENOUS

## 2015-07-20 MED ORDER — ACETAMINOPHEN 500 MG PO TABS
1000.0000 mg | ORAL_TABLET | Freq: Four times a day (QID) | ORAL | Status: DC
  Filled 2015-07-20: qty 2

## 2015-07-20 MED ORDER — NALBUPHINE HCL 10 MG/ML IJ SOLN: 5.0000 mg | INTRAMUSCULAR | Status: DC | PRN

## 2015-07-20 MED ORDER — LACTATED RINGERS IV SOLN
INTRAVENOUS | Status: DC | PRN
  Administered 2015-07-20: 10:00:00 via INTRAVENOUS

## 2015-07-20 MED ORDER — ONDANSETRON HCL 4 MG/2ML IJ SOLN
INTRAMUSCULAR | Status: AC
  Filled 2015-07-20: qty 2

## 2015-07-20 MED ORDER — NALOXONE HCL 0.4 MG/ML IJ SOLN: 0.4000 mg | INTRAMUSCULAR | Status: DC | PRN

## 2015-07-20 MED ORDER — ACETAMINOPHEN 160 MG/5ML PO SOLN
1000.0000 mg | Freq: Four times a day (QID) | ORAL | Status: AC
  Administered 2015-07-20: 1000 mg via ORAL
  Filled 2015-07-20: qty 40.6

## 2015-07-20 MED ORDER — KETOROLAC TROMETHAMINE 30 MG/ML IJ SOLN
30.0000 mg | Freq: Four times a day (QID) | INTRAMUSCULAR | Status: AC | PRN
Start: 2015-07-20 — End: 2015-07-21

## 2015-07-20 MED ORDER — ONDANSETRON HCL 4 MG/2ML IJ SOLN: 4.0000 mg | Freq: Three times a day (TID) | INTRAMUSCULAR | Status: DC | PRN

## 2015-07-20 MED ORDER — WITCH HAZEL-GLYCERIN EX PADS: 1.0000 "application " | MEDICATED_PAD | CUTANEOUS | Status: DC | PRN

## 2015-07-20 MED ORDER — ZOLPIDEM TARTRATE 5 MG PO TABS: 5.0000 mg | ORAL_TABLET | Freq: Every evening | ORAL | Status: DC | PRN

## 2015-07-20 MED ORDER — LACTATED RINGERS IV SOLN
INTRAVENOUS | Status: DC | PRN
  Administered 2015-07-20 (×2): via INTRAVENOUS

## 2015-07-20 MED ORDER — EPHEDRINE SULFATE 50 MG/ML IJ SOLN
INTRAMUSCULAR | Status: DC | PRN
  Administered 2015-07-20: 10 mg via INTRAVENOUS
  Administered 2015-07-20: 15 mg via INTRAVENOUS

## 2015-07-20 MED ORDER — ALBUMIN HUMAN 5 % IV SOLN
INTRAVENOUS | Status: AC
  Filled 2015-07-20: qty 250

## 2015-07-20 MED ORDER — LANOLIN HYDROUS EX OINT
1.0000 | TOPICAL_OINTMENT | CUTANEOUS | Status: DC | PRN
Start: 2015-07-20 — End: 2015-07-22

## 2015-07-20 MED ORDER — ACETAMINOPHEN 160 MG/5ML PO SOLN
650.0000 mg | ORAL | Status: DC | PRN
  Filled 2015-07-20: qty 20.3

## 2015-07-20 MED ORDER — MEPERIDINE HCL 25 MG/ML IJ SOLN: 6.2500 mg | INTRAMUSCULAR | Status: DC | PRN

## 2015-07-20 MED ORDER — TETANUS-DIPHTH-ACELL PERTUSSIS 5-2.5-18.5 LF-MCG/0.5 IM SUSP: 0.5000 mL | Freq: Once | INTRAMUSCULAR | Status: DC

## 2015-07-20 MED ORDER — PHENYLEPHRINE 8 MG IN D5W 100 ML (0.08MG/ML) PREMIX OPTIME
INJECTION | INTRAVENOUS | Status: AC
  Filled 2015-07-20: qty 100

## 2015-07-20 MED ORDER — CEFAZOLIN SODIUM-DEXTROSE 2-3 GM-% IV SOLR
INTRAVENOUS | Status: AC
  Filled 2015-07-20: qty 50

## 2015-07-20 MED ORDER — SIMETHICONE 80 MG PO CHEW
80.0000 mg | CHEWABLE_TABLET | ORAL | Status: DC
  Administered 2015-07-20 – 2015-07-21 (×2): 80 mg via ORAL
  Filled 2015-07-20 (×2): qty 1

## 2015-07-20 MED ORDER — ALBUMIN HUMAN 5 % IV SOLN
12.5000 g | Freq: Once | INTRAVENOUS | Status: AC
  Administered 2015-07-20: 12.5 g via INTRAVENOUS

## 2015-07-20 MED ORDER — OXYCODONE-ACETAMINOPHEN 5-325 MG PO TABS: 2.0000 | ORAL_TABLET | ORAL | Status: DC | PRN

## 2015-07-20 MED ORDER — DIBUCAINE 1 % RE OINT: 1.0000 "application " | TOPICAL_OINTMENT | RECTAL | Status: DC | PRN

## 2015-07-20 MED ORDER — BUPIVACAINE HCL 0.25 % IJ SOLN: INTRAMUSCULAR | Status: DC | PRN

## 2015-07-20 MED ORDER — BUPIVACAINE IN DEXTROSE 0.75-8.25 % IT SOLN
INTRATHECAL | Status: DC | PRN
  Administered 2015-07-20: 1.6 mL via INTRATHECAL

## 2015-07-20 MED ORDER — ONDANSETRON HCL 4 MG/2ML IJ SOLN: 4.0000 mg | Freq: Once | INTRAMUSCULAR | Status: DC | PRN

## 2015-07-20 MED ORDER — LACTATED RINGERS IV SOLN
INTRAVENOUS | Status: DC
  Administered 2015-07-20: 125 mL/h via INTRAVENOUS

## 2015-07-20 MED ORDER — ONDANSETRON HCL 4 MG/2ML IJ SOLN
INTRAMUSCULAR | Status: DC | PRN
  Administered 2015-07-20: 4 mg via INTRAVENOUS

## 2015-07-20 MED ORDER — IBUPROFEN 100 MG/5ML PO SUSP
600.0000 mg | Freq: Four times a day (QID) | ORAL | Status: DC
  Administered 2015-07-20 – 2015-07-22 (×8): 600 mg via ORAL
  Filled 2015-07-20 (×11): qty 30

## 2015-07-20 MED ORDER — METHYLERGONOVINE MALEATE 0.2 MG PO TABS: 0.2000 mg | ORAL_TABLET | ORAL | Status: DC | PRN

## 2015-07-20 MED ORDER — MORPHINE SULFATE (PF) 0.5 MG/ML IJ SOLN
INTRAMUSCULAR | Status: AC
  Filled 2015-07-20: qty 10

## 2015-07-20 MED ORDER — OXYCODONE-ACETAMINOPHEN 5-325 MG PO TABS
1.0000 | ORAL_TABLET | ORAL | Status: DC | PRN
  Administered 2015-07-21 – 2015-07-22 (×3): 1 via ORAL
  Filled 2015-07-20 (×3): qty 1

## 2015-07-20 MED ORDER — MORPHINE SULFATE (PF) 0.5 MG/ML IJ SOLN
INTRAMUSCULAR | Status: DC | PRN
  Administered 2015-07-20: .2 mg via INTRATHECAL

## 2015-07-20 MED ORDER — OXYTOCIN 10 UNIT/ML IJ SOLN
INTRAMUSCULAR | Status: AC
Start: 2015-07-20 — End: 2015-07-20
  Filled 2015-07-20: qty 4

## 2015-07-20 MED ORDER — SCOPOLAMINE 1 MG/3DAYS TD PT72
1.0000 | MEDICATED_PATCH | Freq: Once | TRANSDERMAL | Status: DC
  Administered 2015-07-20: 1.5 mg via TRANSDERMAL

## 2015-07-20 MED ORDER — OXYTOCIN 10 UNIT/ML IJ SOLN
40.0000 [IU] | INTRAMUSCULAR | Status: DC | PRN
  Administered 2015-07-20: 40 [IU] via INTRAVENOUS

## 2015-07-20 MED ORDER — PHENYLEPHRINE HCL 10 MG/ML IJ SOLN
INTRAMUSCULAR | Status: DC | PRN
  Administered 2015-07-20: 120 ug via INTRAVENOUS
  Administered 2015-07-20: 80 ug via INTRAVENOUS

## 2015-07-20 MED ORDER — PHENYLEPHRINE 8 MG IN D5W 100 ML (0.08MG/ML) PREMIX OPTIME
INJECTION | INTRAVENOUS | Status: DC | PRN
  Administered 2015-07-20: 60 ug/min via INTRAVENOUS

## 2015-07-20 MED ORDER — OXYTOCIN 40 UNITS IN LACTATED RINGERS INFUSION - SIMPLE MED: 62.5000 mL/h | INTRAVENOUS | Status: AC

## 2015-07-20 MED ORDER — BUPIVACAINE HCL (PF) 0.25 % IJ SOLN
INTRAMUSCULAR | Status: AC
  Filled 2015-07-20: qty 30

## 2015-07-20 MED ORDER — KETOROLAC TROMETHAMINE 30 MG/ML IJ SOLN
INTRAMUSCULAR | Status: AC
  Administered 2015-07-20: 30 mg via INTRAMUSCULAR
  Filled 2015-07-20: qty 1

## 2015-07-20 MED ORDER — SCOPOLAMINE 1 MG/3DAYS TD PT72
MEDICATED_PATCH | TRANSDERMAL | Status: AC
  Administered 2015-07-20: 1.5 mg via TRANSDERMAL
  Filled 2015-07-20: qty 1

## 2015-07-20 MED ORDER — SIMETHICONE 80 MG PO CHEW
80.0000 mg | CHEWABLE_TABLET | Freq: Three times a day (TID) | ORAL | Status: DC
  Administered 2015-07-20 – 2015-07-22 (×5): 80 mg via ORAL
  Filled 2015-07-20 (×5): qty 1

## 2015-07-20 MED ORDER — EPHEDRINE 5 MG/ML INJ
INTRAVENOUS | Status: AC
  Filled 2015-07-20: qty 10

## 2015-07-20 MED ORDER — LACTATED RINGERS IV SOLN
Freq: Once | INTRAVENOUS | Status: AC
  Administered 2015-07-20: 09:00:00 via INTRAVENOUS

## 2015-07-20 MED ORDER — PHENYLEPHRINE 40 MCG/ML (10ML) SYRINGE FOR IV PUSH (FOR BLOOD PRESSURE SUPPORT)
PREFILLED_SYRINGE | INTRAVENOUS | Status: AC
  Filled 2015-07-20: qty 10

## 2015-07-20 MED ORDER — KETOROLAC TROMETHAMINE 30 MG/ML IJ SOLN
30.0000 mg | Freq: Four times a day (QID) | INTRAMUSCULAR | Status: AC | PRN
  Administered 2015-07-20: 30 mg via INTRAMUSCULAR

## 2015-07-20 MED ORDER — ACETAMINOPHEN 325 MG PO TABS: 650.0000 mg | ORAL_TABLET | ORAL | Status: DC | PRN

## 2015-07-20 MED ORDER — GLYCOPYRROLATE 0.2 MG/ML IJ SOLN
INTRAMUSCULAR | Status: AC
  Filled 2015-07-20: qty 1

## 2015-07-20 MED ORDER — DIPHENHYDRAMINE HCL 25 MG PO CAPS: 25.0000 mg | ORAL_CAPSULE | ORAL | Status: DC | PRN

## 2015-07-20 MED ORDER — METHYLERGONOVINE MALEATE 0.2 MG/ML IJ SOLN: 0.2000 mg | INTRAMUSCULAR | Status: DC | PRN

## 2015-07-20 MED ORDER — IBUPROFEN 600 MG PO TABS
600.0000 mg | ORAL_TABLET | Freq: Four times a day (QID) | ORAL | Status: DC
  Filled 2015-07-20: qty 1

## 2015-07-20 MED ORDER — DIPHENHYDRAMINE HCL 25 MG PO CAPS: 25.0000 mg | ORAL_CAPSULE | Freq: Four times a day (QID) | ORAL | Status: DC | PRN

## 2015-07-20 MED ORDER — SCOPOLAMINE 1 MG/3DAYS TD PT72: 1.0000 | MEDICATED_PATCH | Freq: Once | TRANSDERMAL | Status: DC

## 2015-07-20 MED ORDER — FENTANYL CITRATE (PF) 100 MCG/2ML IJ SOLN
INTRAMUSCULAR | Status: AC
  Filled 2015-07-20: qty 2

## 2015-07-20 MED ORDER — FENTANYL CITRATE (PF) 100 MCG/2ML IJ SOLN
25.0000 ug | INTRAMUSCULAR | Status: DC | PRN
  Administered 2015-07-20: 10 ug via INTRAVENOUS

## 2015-07-20 MED ORDER — PRENATAL MULTIVITAMIN CH
1.0000 | ORAL_TABLET | Freq: Every day | ORAL | Status: DC
Start: 2015-07-20 — End: 2015-07-21

## 2015-07-20 MED ORDER — SIMETHICONE 80 MG PO CHEW
80.0000 mg | CHEWABLE_TABLET | ORAL | Status: DC | PRN
Start: 2015-07-20 — End: 2015-07-22

## 2015-07-20 MED ORDER — SENNOSIDES-DOCUSATE SODIUM 8.6-50 MG PO TABS
2.0000 | ORAL_TABLET | ORAL | Status: DC
  Administered 2015-07-20 – 2015-07-21 (×2): 2 via ORAL
  Filled 2015-07-20 (×2): qty 2

## 2015-07-20 MED ORDER — CEFAZOLIN SODIUM-DEXTROSE 2-3 GM-% IV SOLR
2.0000 g | INTRAVENOUS | Status: AC
  Administered 2015-07-20: 2 g via INTRAVENOUS

## 2015-07-20 MED ORDER — LACTATED RINGERS IV BOLUS (SEPSIS): 500.0000 mL | Freq: Once | INTRAVENOUS | Status: DC

## 2015-07-20 MED ORDER — SODIUM CHLORIDE 0.9 % IJ SOLN: 3.0000 mL | INTRAMUSCULAR | Status: DC | PRN

## 2015-07-20 SURGICAL SUPPLY — 34 items
CLAMP CORD UMBIL (MISCELLANEOUS) IMPLANT
CLOTH BEACON ORANGE TIMEOUT ST (SAFETY) ×3 IMPLANT
DRAPE SHEET LG 3/4 BI-LAMINATE (DRAPES) IMPLANT
DRSG OPSITE POSTOP 4X10 (GAUZE/BANDAGES/DRESSINGS) ×3 IMPLANT
DURAPREP 26ML APPLICATOR (WOUND CARE) ×3 IMPLANT
ELECT REM PT RETURN 9FT ADLT (ELECTROSURGICAL) ×3
ELECTRODE REM PT RTRN 9FT ADLT (ELECTROSURGICAL) ×1 IMPLANT
EXTRACTOR VACUUM M CUP 4 TUBE (SUCTIONS) IMPLANT
EXTRACTOR VACUUM M CUP 4' TUBE (SUCTIONS)
GLOVE BIO SURGEON STRL SZ7 (GLOVE) ×3 IMPLANT
GLOVE BIOGEL PI IND STRL 7.0 (GLOVE) ×1 IMPLANT
GLOVE BIOGEL PI INDICATOR 7.0 (GLOVE) ×2
GOWN STRL REUS W/TWL LRG LVL3 (GOWN DISPOSABLE) ×6 IMPLANT
KIT ABG SYR 3ML LUER SLIP (SYRINGE) IMPLANT
LIQUID BAND (GAUZE/BANDAGES/DRESSINGS) ×3 IMPLANT
NEEDLE HYPO 22GX1.5 SAFETY (NEEDLE) IMPLANT
NEEDLE HYPO 25X1 1.5 SAFETY (NEEDLE) ×3 IMPLANT
NEEDLE HYPO 25X5/8 SAFETYGLIDE (NEEDLE) IMPLANT
NS IRRIG 1000ML POUR BTL (IV SOLUTION) ×3 IMPLANT
PACK C SECTION WH (CUSTOM PROCEDURE TRAY) ×3 IMPLANT
PAD OB MATERNITY 4.3X12.25 (PERSONAL CARE ITEMS) ×3 IMPLANT
PENCIL SMOKE EVAC W/HOLSTER (ELECTROSURGICAL) ×3 IMPLANT
RETRACTOR WND ALEXIS 25 LRG (MISCELLANEOUS) ×2 IMPLANT
RTRCTR C-SECT PINK 25CM LRG (MISCELLANEOUS) ×3 IMPLANT
RTRCTR WOUND ALEXIS 25CM LRG (MISCELLANEOUS) ×6
SUT CHROMIC 1 CTX 36 (SUTURE) ×6 IMPLANT
SUT CHROMIC 2 0 CT 1 (SUTURE) ×3 IMPLANT
SUT PDS AB 0 CTX 60 (SUTURE) ×3 IMPLANT
SUT VIC AB 2-0 CT1 27 (SUTURE) ×2
SUT VIC AB 2-0 CT1 TAPERPNT 27 (SUTURE) ×1 IMPLANT
SUT VIC AB 4-0 KS 27 (SUTURE) IMPLANT
SYR 30ML LL (SYRINGE) IMPLANT
TOWEL OR 17X24 6PK STRL BLUE (TOWEL DISPOSABLE) ×3 IMPLANT
TRAY FOLEY CATH SILVER 14FR (SET/KITS/TRAYS/PACK) ×3 IMPLANT

## 2015-07-20 NOTE — Op Note (Signed)
Pre-Operative Diagnosis: 1) 41 week intrauterine pregnancy 2) history of prior cesarean section 3) uterine fibroids Postoperative Diagnosis: Same and fetal malpresentation Procedure: Repeat cesarean section Surgeon: Dr. Vanessa Kick Assistant: None Operative Findings: Female infant in the transverse back up lie. Large 8 cm fibroid on right lateral uterus. Unable to visualize ovaries and tubes.  Specimen: placenta for disposal EBL: Total I/O In: 1900 [I.V.:1900] Out: 850 [Urine:100; Blood:750]   Procedure:Ms. Zenia Resides is an 29 year old gravida 2 para 0010 at 20 weeks and 0 days estimated gestational age who presents for cesarean section. Following the appropriate informed consent the patient was brought to the operating room where spinal anesthesia was administered and found to be adequate. She was placed in the dorsal supine position with a leftward tilt. She was prepped and draped in the normal sterile fashion. Scalpel was then used to make a Pfannenstiel skin incision which was carried down to the underlying layers of soft tissue to the fascia. The fascia was incised in the midline and the fascial incision was extended laterally with Mayo scissors. The superior aspect of the fascial incision was grasped with Coker clamps x2, tented up and the rectus muscles dissected off sharply with the electrocautery unit area and the same procedure was repeated on the inferior aspect of the fascial incision. The rectus muscles were separated in the midline. The abdominal peritoneum was identified, tented up, entered sharply, and the incision was extended superiorly and inferiorly with good visualization of the bladder. The Alexis retractor was then deployed. Scalpel was then used to make a low transverse incision on the uterus which was extended laterally with blunt dissection. The fetal feet were identified, delivered easily through the uterine incision using the standard breech maneuvers. The infant was bulb suctioned  on the operative field cried vigorously, cord was clamped and cut and the infant was passed to the waiting neonatologist. Placenta was then delivered spontaneously, the uterus was cleared of all clot and debris. The uterine incision was repaired with #1 chromic in running locked fashion. The Alexis retractor was removed. The abdominal peritoneum was reapproximated with 2-0 Vicryl in a running fashion, the rectus muscles was reapproximated with 2-0 chromic in a running fashion. The fascia was closed with a looped PDS in a running fashion. The skin was closed with 4-0 vicryl in a subcuticular fashion and surgical skin glue. All sponge lap and needle counts were correct x2. Patient tolerated the procedure well and recovered in stable condition following the procedure.

## 2015-07-20 NOTE — Transfer of Care (Signed)
Immediate Anesthesia Transfer of Care Note  Patient: Barbara Bryant  Procedure(s) Performed: Procedure(s): CESAREAN SECTION (N/A)  Patient Location: PACU  Anesthesia Type:Spinal  Level of Consciousness: awake, alert  and oriented  Airway & Oxygen Therapy: Patient Spontanous Breathing  Post-op Assessment: Report given to RN and Post -op Vital signs reviewed and stable  Post vital signs: Reviewed and stable  Last Vitals:  Filed Vitals:   07/20/15 0810  BP: 126/86  Pulse: 100  Temp: 36.7 C  Resp: 20    Complications: No apparent anesthesia complications

## 2015-07-20 NOTE — Lactation Note (Signed)
This note was copied from the chart of Barbara Bryant. Lactation Consultation Note  Patient Name: Barbara Bryant Today's Date: 07/20/2015 Reason for consult: Initial assessment;Other (Comment) (short to tight anterior frenulum , see LC note for details )  Baby is 6 1/2 hours old, has been to the breast 3 times and consult. Latch score 6-8-8. Void x1 , HNS.  LC assessed oral cavity with gloved finger and noted a short stretchy anterior frenulum ,high palate, short labial frenulum above  Gum line, upper lip stretches well with assessment , and when latched at the breast. Cupping of the tongue noted when baby was sucking  On gloved finger.  @ consult baby awake and rooting - LC assisted with pillow support and latching with depth. Baby latched , opens mouth wide , lips flanged  ( good flow of colostrum with hand expressing prior to latch ) , and sustains latch and sucks / swallows several times , and then proceeds to  Sustain latch and and becomes non - nutritive or just sits, eventually released and sucks on hands acting still hungry.  Redby suspects it's the frenulum issue causing this feeding pattern .  LC plan - Breast massage , hand express, pre-pump to prime the milk ducts , breast compressions with latch until consistent swallows and then intermittent.  LC spoke with Dr. Doreatha Martin recommending a tongue release.  Mother informed of post-discharge support and given phone number to the lactation department, including services for phone call assistance; out-patient appointments; and breastfeeding support group. List of other breastfeeding resources in the community given in the handout. Encouraged mother to call for problems or concerns related to breastfeeding.   Maternal Data Has patient been taught Hand Expression?: Yes Does the patient have breastfeeding experience prior to this delivery?: Yes  Feeding Feeding Type: Breast Fed Length of feed: 15 min (on and off sucking pattern - see LC  note )  LATCH Score/Interventions Latch: Grasps breast easily, tongue down, lips flanged, rhythmical sucking. (stays latched , but sucks a few times and stops ) Intervention(s): Adjust position;Assist with latch;Breast massage;Breast compression  Audible Swallowing: A few with stimulation Intervention(s): Skin to skin Intervention(s): Skin to skin  Type of Nipple: Everted at rest and after stimulation  Comfort (Breast/Nipple): Soft / non-tender     Hold (Positioning): Assistance needed to correctly position infant at breast and maintain latch. Intervention(s): Breastfeeding basics reviewed;Support Pillows;Position options;Skin to skin (see LC note )  LATCH Score: 8  Lactation Tools Discussed/Used Tools: Pump Breast pump type: Manual WIC Program: No Pump Review: Setup, frequency, and cleaning;Milk Storage Initiated by:: MAI  Date initiated:: 07/20/15   Consult Status Consult Status: Follow-up Date: 07/21/15 Follow-up type: In-patient    Myer Haff 07/20/2015, 6:16 PM

## 2015-07-20 NOTE — H&P (Signed)
Barbara Bryant is a 28 y.o. female presenting for repeat cesarean  29 yo G3P0111 @ 41+0 presents for a repeat cesarean section. The patient had desired TOL but when labor did not occur she requested repeat cesarean. Her pregnancy has been complicated by postterm and uterine fibroids. History OB History    Gravida Para Term Preterm AB TAB SAB Ectopic Multiple Living   3 1  1 1  1   1      Past Medical History  Diagnosis Date  . Abnormal Pap smear   . Anemia   . Fibroid   . Vaginal Pap smear, abnormal     has not had followup   Past Surgical History  Procedure Laterality Date  . Wisdom tooth extraction    . No past surgeries    . Cesarean section N/A 01/17/2014    Procedure: CESAREAN SECTION;  Surgeon: Daria Pastures, MD;  Location: [facility];  Service: Obstetrics;  Laterality: N/A;   Family History: family history includes Heart disease in her maternal grandmother. There is no history of Hearing loss. Social History:  reports that she has never smoked. She has never used smokeless tobacco. She reports that she does not drink alcohol or use illicit drugs.   Prenatal Transfer Tool  Maternal Diabetes: No Genetic Screening: Normal Maternal Ultrasounds/Referrals: Abnormal:  Findings:   Other:fibroids Fetal Ultrasounds or other Referrals:  None Maternal Substance Abuse:  No Significant Maternal Medications:  None Significant Maternal Lab Results:  None Other Comments:  None  ROS    Blood pressure 126/86, pulse 100, temperature 98 F (36.7 C), temperature source Oral, resp. rate 20, height 5\' 5"  (1.651 m), weight 198 lb (89.812 kg), last menstrual period 09/26/2014, SpO2 100 %, unknown if currently breastfeeding. Exam Physical Exam  Prenatal labs: ABO, Rh:  A+ Antibody:  Negative Rubella:  Imm RPR:   NR HBsAg:   Neg HIV:   NR GBS:   Post  Assessment/Plan: 1) Admit 2) Ancef OCTOR 3) SCDs   Barbara Bryant H. 07/20/2015, 8:59 AM

## 2015-07-20 NOTE — Progress Notes (Signed)
UR chart review completed.  

## 2015-07-20 NOTE — Anesthesia Postprocedure Evaluation (Signed)
Anesthesia Post Note  Patient: Barbara Bryant  Procedure(s) Performed: Procedure(s) (LRB): CESAREAN SECTION (N/A)  Patient location during evaluation: PACU Anesthesia Type: Spinal Level of consciousness: awake and alert Pain management: pain level controlled Vital Signs Assessment: post-procedure vital signs reviewed and stable Respiratory status: spontaneous breathing, nonlabored ventilation and respiratory function stable Cardiovascular status: blood pressure returned to baseline and stable Postop Assessment: No signs of nausea or vomiting, Patient able to bend at knees and Spinal receding Anesthetic complications: no    Last Vitals:  Filed Vitals:   07/20/15 1300 07/20/15 1311  BP: 100/52 99/50  Pulse: 65 61  Temp:  36.4 C  Resp: 17 18    Last Pain:  Filed Vitals:   07/20/15 1340  PainSc: 2                  Ladarrius Bogdanski JENNETTE

## 2015-07-21 LAB — CBC
HCT: 20.4 % — ABNORMAL LOW (ref 36.0–46.0)
Hemoglobin: 6.5 g/dL — CL (ref 12.0–15.0)
MCH: 26 pg (ref 26.0–34.0)
MCHC: 31.9 g/dL (ref 30.0–36.0)
MCV: 81.6 fL (ref 78.0–100.0)
PLATELETS: 216 10*3/uL (ref 150–400)
RBC: 2.5 MIL/uL — AB (ref 3.87–5.11)
RDW: 15.3 % (ref 11.5–15.5)
WBC: 9.2 10*3/uL (ref 4.0–10.5)

## 2015-07-21 MED ORDER — COMPLETENATE 29-1 MG PO CHEW
1.0000 | CHEWABLE_TABLET | Freq: Every day | ORAL | Status: DC
  Administered 2015-07-21 – 2015-07-22 (×2): 1 via ORAL
  Filled 2015-07-21 (×2): qty 1

## 2015-07-21 NOTE — Progress Notes (Signed)
CRITICAL VALUE ALERT  Critical value received:  hgb 6.5   Date of notification:  07/21/15   Time of notification:  0601   Critical value read back:Yes.    Nurse who received alert:  Levada Dy Lonnette Shrode  MD notified (1st page):  Dr Harrington Challenger   Time of first page: 0601  MD notified (2nd page):  Time of second page:  Responding MD: dr Harrington Challenger  Time MD responded:  [phone]

## 2015-07-21 NOTE — Progress Notes (Signed)
Subjective: Postpartum Day 1: Cesarean Delivery Patient reports pain controlled, denies nausea or vomiting. Not passing flatus yet    Objective: Vital signs in last 24 hours: Temp:  [97.9 F (36.6 C)-99 F (37.2 C)] 98.1 F (36.7 C) (11/23 0915) Pulse Rate:  [71-90] 86 (11/23 0915) Resp:  [18-20] 18 (11/23 0915) BP: (102-112)/(37-63) 105/55 mmHg (11/23 0915) SpO2:  [100 %] 100 % (11/23 0915)  Physical Exam:  General: alert, cooperative and appears stated age 29: appropriate Uterine Fundus: firm Incision: healing well DVT Evaluation: No evidence of DVT seen on physical exam.   Recent Labs  07/20/15 0808 07/21/15 0530  HGB 8.8* 6.5*  HCT 28.4* 20.4*    Assessment/Plan: Status post Cesarean section. Doing well postoperatively.  Continue current care.  Amauri Medellin H. 07/21/2015, 2:31 PM

## 2015-07-22 MED ORDER — FERROUS SULFATE 325 (65 FE) MG PO TABS: 325.0000 mg | ORAL_TABLET | Freq: Three times a day (TID) | ORAL | Status: AC

## 2015-07-22 MED ORDER — DOCUSATE SODIUM 100 MG PO CAPS: 100.0000 mg | ORAL_CAPSULE | Freq: Two times a day (BID) | ORAL | Status: AC | PRN

## 2015-07-22 MED ORDER — FERROUS SULFATE 300 (60 FE) MG/5ML PO SYRP
300.0000 mg | ORAL_SOLUTION | Freq: Three times a day (TID) | ORAL | Status: DC
  Administered 2015-07-22: 300 mg via ORAL
  Filled 2015-07-22: qty 5

## 2015-07-22 MED ORDER — OXYCODONE-ACETAMINOPHEN 5-325 MG PO TABS: 2.0000 | ORAL_TABLET | ORAL | Status: AC | PRN

## 2015-07-22 MED ORDER — FERROUS SULFATE 325 (65 FE) MG PO TABS
325.0000 mg | ORAL_TABLET | Freq: Three times a day (TID) | ORAL | Status: DC
  Filled 2015-07-22: qty 1

## 2015-07-22 MED ORDER — DOCUSATE SODIUM 100 MG PO CAPS
100.0000 mg | ORAL_CAPSULE | Freq: Two times a day (BID) | ORAL | Status: DC | PRN
  Administered 2015-07-22: 100 mg via ORAL
  Filled 2015-07-22: qty 1

## 2015-07-22 NOTE — Lactation Note (Signed)
This note was copied from the chart of Barbara Bryant. Lactation Consultation Note; Mom reports baby is nursing well with no pain. No questions at present. To call prn  Patient Name: Barbara Bryant Today's Date: 07/22/2015 Reason for consult: Follow-up assessment   Maternal Data    Feeding Feeding Type: Breast Fed  LATCH Score/Interventions Latch: Grasps breast easily, tongue down, lips flanged, rhythmical sucking.  Audible Swallowing: Spontaneous and intermittent (easily expresses milk) Intervention(s): Skin to skin Intervention(s): Hand expression  Type of Nipple: Everted at rest and after stimulation  Comfort (Breast/Nipple): Soft / non-tender     Hold (Positioning): No assistance needed to correctly position infant at breast. Intervention(s): Position options;Support Pillows  LATCH Score: 10  Lactation Tools Discussed/Used     Consult Status Consult Status: Complete    Truddie Crumble 07/22/2015, 9:43 AM

## 2015-07-22 NOTE — Discharge Summary (Signed)
Obstetric Discharge Summary Reason for Admission: cesarean section Prenatal Procedures: ultrasound Intrapartum Procedures: cesarean: low cervical, transverse Postpartum Procedures: none Complications-Operative and Postpartum: anemia to Hb 6.5, asymptomatic HEMOGLOBIN  Date Value Ref Range Status  07/21/2015 6.5* 12.0 - 15.0 g/dL Final    Comment:    DELTA CHECK NOTED REPEATED TO VERIFY CRITICAL RESULT CALLED TO, READ BACK BY AND VERIFIED WITH: BERRIER,A. @0558  ON 07/21/2015 BY BOVELL,A.    HCT  Date Value Ref Range Status  07/21/2015 20.4* 36.0 - 46.0 % Final    Physical Exam:  General: alert and cooperative Lochia: appropriate Uterine Fundus: firm Incision: healing well, no significant drainage DVT Evaluation: No evidence of DVT seen on physical exam.  Discharge Diagnoses: Term Pregnancy-delivered  Discharge Information: Date: 07/22/2015 Activity: pelvic rest Diet: routine Medications: PNV, Ibuprofen, Colace, Iron and Percocet Condition: stable Instructions: refer to practice specific booklet Discharge to: home Follow-up Information    Follow up with Marcial Pacas., MD In 4 weeks.   Specialty:  Obstetrics and Gynecology   Contact information:   [address] [address] [phone]       Newborn Data: Live born female  Birth Weight: 7 lb 4.6 oz (3305 g) APGAR: 5, 9  Home with mother.  Allyn Kenner 07/22/2015, 10:59 AM

## 2015-07-22 NOTE — Progress Notes (Signed)
Pt doing well, denies CP/SOB, dizziness.  Denies heavy vaginal bleeding. Abd: soft, appropriate post op tenderness Inc: c/d/i Ext: no Ct A/P: POD #2 Anemia Hb 6.5 - pt asymptomatic and desires d/c home Advised Fe tid and stool softener -prescribed. Precautions reviewed.

## 2015-07-30 ENCOUNTER — Encounter (HOSPITAL_COMMUNITY): Payer: Self-pay | Admitting: Obstetrics and Gynecology

## 2015-09-01 ENCOUNTER — Other Ambulatory Visit: Payer: Self-pay | Admitting: Obstetrics and Gynecology

## 2015-09-02 LAB — CYTOLOGY - PAP

## 2015-09-07 ENCOUNTER — Telehealth (HOSPITAL_COMMUNITY): Payer: Self-pay | Admitting: Lactation Services

## 2015-09-07 NOTE — Telephone Encounter (Signed)
Baby has a "rough red rash" on her cheeks, chin, under her neck, and arms. It looked differently when they were at the Peds a couple of weeks ago and they don't go back until next week. Maybe it is the soap mom is using. Try changing soap (body and clothes) to what she is using on the baby for a few days. If the soap change does not work then keep a food diary for 3-4 days and make an OP apt to talk about a diet change.

## 2020-07-15 DIAGNOSIS — Z30431 Encounter for routine checking of intrauterine contraceptive device: Secondary | ICD-10-CM | POA: Diagnosis not present

## 2020-07-15 DIAGNOSIS — Z01419 Encounter for gynecological examination (general) (routine) without abnormal findings: Secondary | ICD-10-CM | POA: Diagnosis not present

## 2020-07-15 DIAGNOSIS — D259 Leiomyoma of uterus, unspecified: Secondary | ICD-10-CM | POA: Diagnosis not present

## 2020-07-19 DIAGNOSIS — J069 Acute upper respiratory infection, unspecified: Secondary | ICD-10-CM | POA: Diagnosis not present

## 2020-08-26 DIAGNOSIS — Z03818 Encounter for observation for suspected exposure to other biological agents ruled out: Secondary | ICD-10-CM | POA: Diagnosis not present

## 2020-09-09 ENCOUNTER — Other Ambulatory Visit (HOSPITAL_COMMUNITY): Payer: Self-pay | Admitting: Obstetrics and Gynecology

## 2020-09-09 DIAGNOSIS — B373 Candidiasis of vulva and vagina: Secondary | ICD-10-CM | POA: Diagnosis not present

## 2020-09-09 DIAGNOSIS — Z23 Encounter for immunization: Secondary | ICD-10-CM | POA: Diagnosis not present

## 2020-09-09 DIAGNOSIS — N898 Other specified noninflammatory disorders of vagina: Secondary | ICD-10-CM | POA: Diagnosis not present

## 2020-09-09 MED FILL — FLUCONAZOLE 150 MG TABS: 150 | 1 days supply | Qty: 1 | Fill #0

## 2020-09-09 MED FILL — NYSTATIN-TRIAMCINOLONE CRM: 100000-0.1 | 10 days supply | Qty: 15 | Fill #0

## 2020-10-06 DIAGNOSIS — B373 Candidiasis of vulva and vagina: Secondary | ICD-10-CM | POA: Diagnosis not present

## 2020-10-06 DIAGNOSIS — N898 Other specified noninflammatory disorders of vagina: Secondary | ICD-10-CM | POA: Diagnosis not present

## 2020-10-06 MED FILL — FLUCONAZOLE 150 MG TABS: 150 | 1 days supply | Qty: 1 | Fill #1

## 2021-06-27 ENCOUNTER — Other Ambulatory Visit (HOSPITAL_COMMUNITY): Payer: Self-pay

## 2021-06-27 DIAGNOSIS — Z0000 Encounter for general adult medical examination without abnormal findings: Secondary | ICD-10-CM | POA: Diagnosis not present

## 2021-06-27 DIAGNOSIS — Z13220 Encounter for screening for lipoid disorders: Secondary | ICD-10-CM | POA: Diagnosis not present

## 2021-06-27 MED ORDER — MOMETASONE FUROATE 0.1 % EX CREA
1.0000 "application " | TOPICAL_CREAM | Freq: Every day | CUTANEOUS | 0 refills | Status: AC
  Filled 2021-06-27: qty 45, 30d supply, fill #0

## 2021-06-30 ENCOUNTER — Other Ambulatory Visit (HOSPITAL_COMMUNITY): Payer: Self-pay

## 2021-08-17 DIAGNOSIS — N6322 Unspecified lump in the left breast, upper inner quadrant: Secondary | ICD-10-CM | POA: Diagnosis not present

## 2021-08-17 DIAGNOSIS — N63 Unspecified lump in unspecified breast: Secondary | ICD-10-CM | POA: Diagnosis not present

## 2021-08-23 ENCOUNTER — Other Ambulatory Visit: Payer: Self-pay | Admitting: Obstetrics and Gynecology

## 2021-08-23 DIAGNOSIS — N6320 Unspecified lump in the left breast, unspecified quadrant: Secondary | ICD-10-CM

## 2021-08-30 ENCOUNTER — Other Ambulatory Visit: Payer: Self-pay | Admitting: Obstetrics and Gynecology

## 2021-08-30 ENCOUNTER — Ambulatory Visit
Admission: RE | Admit: 2021-08-30 | Discharge: 2021-08-30 | Disposition: A | Discharge status: Home / Self Care | Payer: Self-pay | Source: Ambulatory Visit | Attending: Obstetrics and Gynecology | Admitting: Obstetrics and Gynecology

## 2021-08-30 ENCOUNTER — Ambulatory Visit
Admission: RE | Admit: 2021-08-30 | Discharge: 2021-08-30 | Disposition: A | Discharge status: Home / Self Care | Payer: PRIVATE HEALTH INSURANCE | Source: Ambulatory Visit | Attending: Obstetrics and Gynecology | Admitting: Obstetrics and Gynecology

## 2021-08-30 DIAGNOSIS — N6320 Unspecified lump in the left breast, unspecified quadrant: Secondary | ICD-10-CM

## 2021-08-30 DIAGNOSIS — N6312 Unspecified lump in the right breast, upper inner quadrant: Secondary | ICD-10-CM

## 2021-09-19 ENCOUNTER — Other Ambulatory Visit: Payer: Self-pay

## 2021-10-24 ENCOUNTER — Other Ambulatory Visit (HOSPITAL_COMMUNITY): Payer: Self-pay

## 2021-10-25 ENCOUNTER — Other Ambulatory Visit (HOSPITAL_COMMUNITY): Payer: Self-pay

## 2021-10-25 MED ORDER — NYSTATIN-TRIAMCINOLONE 100000-0.1 UNIT/GM-% EX CREA
1.0000 "application " | TOPICAL_CREAM | Freq: Two times a day (BID) | CUTANEOUS | 1 refills | Status: AC
  Filled 2021-10-25: qty 15, 8d supply, fill #0

## 2022-03-01 ENCOUNTER — Other Ambulatory Visit: Payer: Self-pay

## 2022-03-08 ENCOUNTER — Other Ambulatory Visit: Payer: Self-pay | Admitting: Obstetrics and Gynecology

## 2022-03-08 ENCOUNTER — Ambulatory Visit
Admission: RE | Admit: 2022-03-08 | Discharge: 2022-03-08 | Disposition: A | Discharge status: Home / Self Care | Payer: Self-pay | Source: Ambulatory Visit | Attending: Obstetrics and Gynecology | Admitting: Obstetrics and Gynecology

## 2022-03-08 DIAGNOSIS — N6312 Unspecified lump in the right breast, upper inner quadrant: Secondary | ICD-10-CM

## 2022-09-08 ENCOUNTER — Ambulatory Visit
Admission: RE | Admit: 2022-09-08 | Discharge: 2022-09-08 | Disposition: A | Discharge status: Home / Self Care | Payer: BC Managed Care – PPO | Source: Ambulatory Visit | Attending: Obstetrics and Gynecology | Admitting: Obstetrics and Gynecology

## 2022-09-08 DIAGNOSIS — N6312 Unspecified lump in the right breast, upper inner quadrant: Secondary | ICD-10-CM

## 2023-03-29 IMAGING — MG DIGITAL DIAGNOSTIC BILAT W/ TOMO W/ CAD
6 of 10 series · 6 of 30 positions shown · non-contrast
Comparison: None.

CLINICAL DATA: Patient reports a palpable abnormality in the left
breast near the nipple.



[R MLO synth-2D]
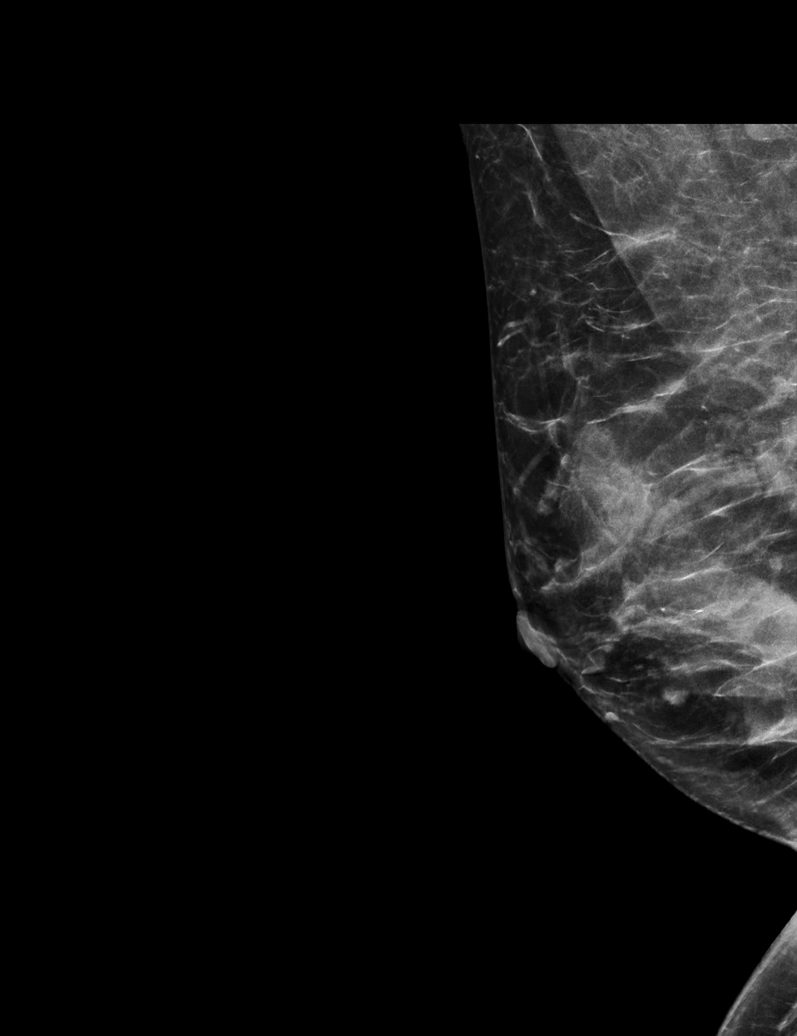

[L CC synth-2D]
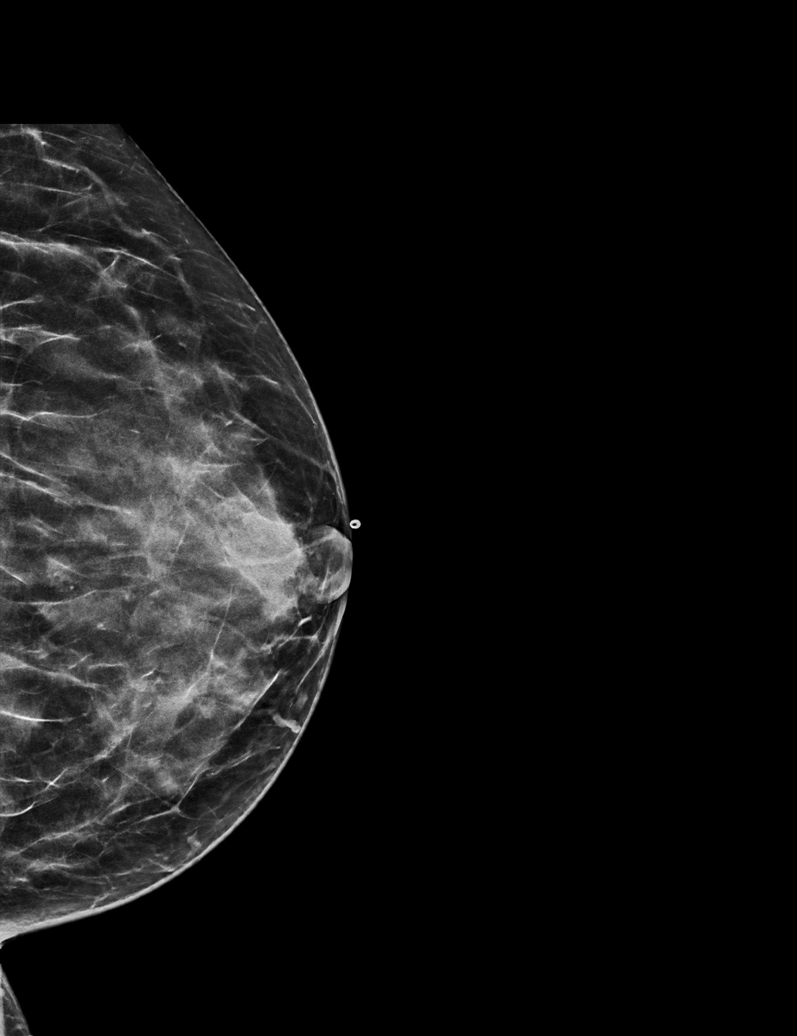

[L MLO synth-2D]
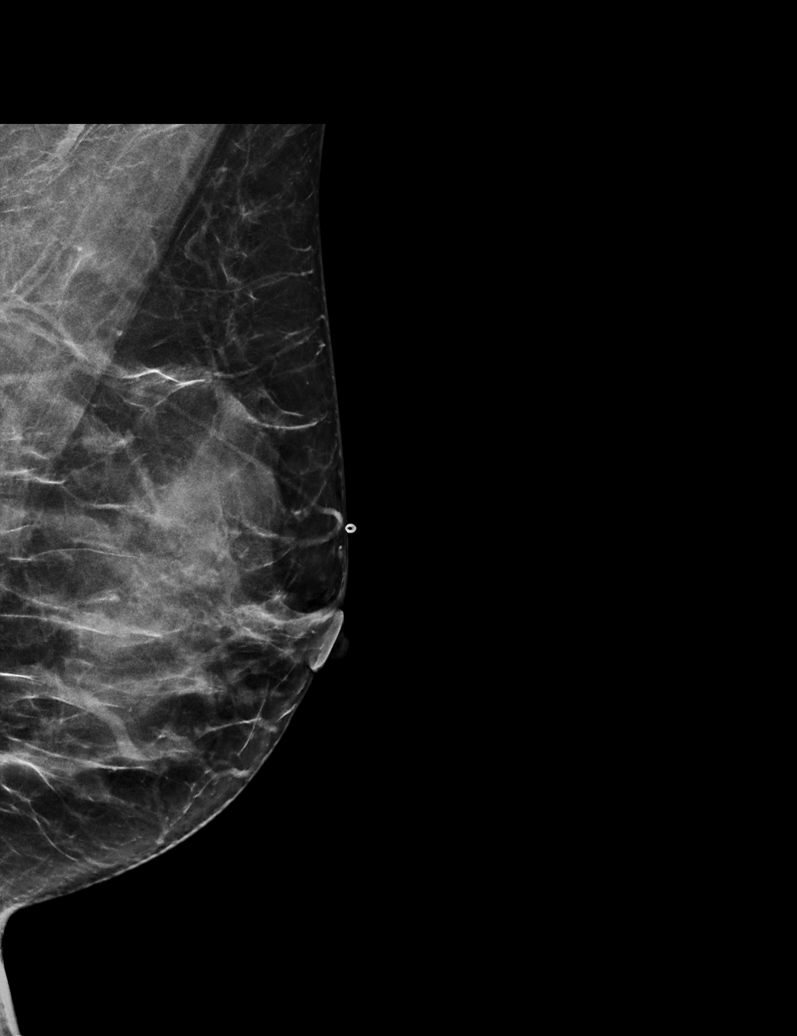

[L TAN synth-2D]
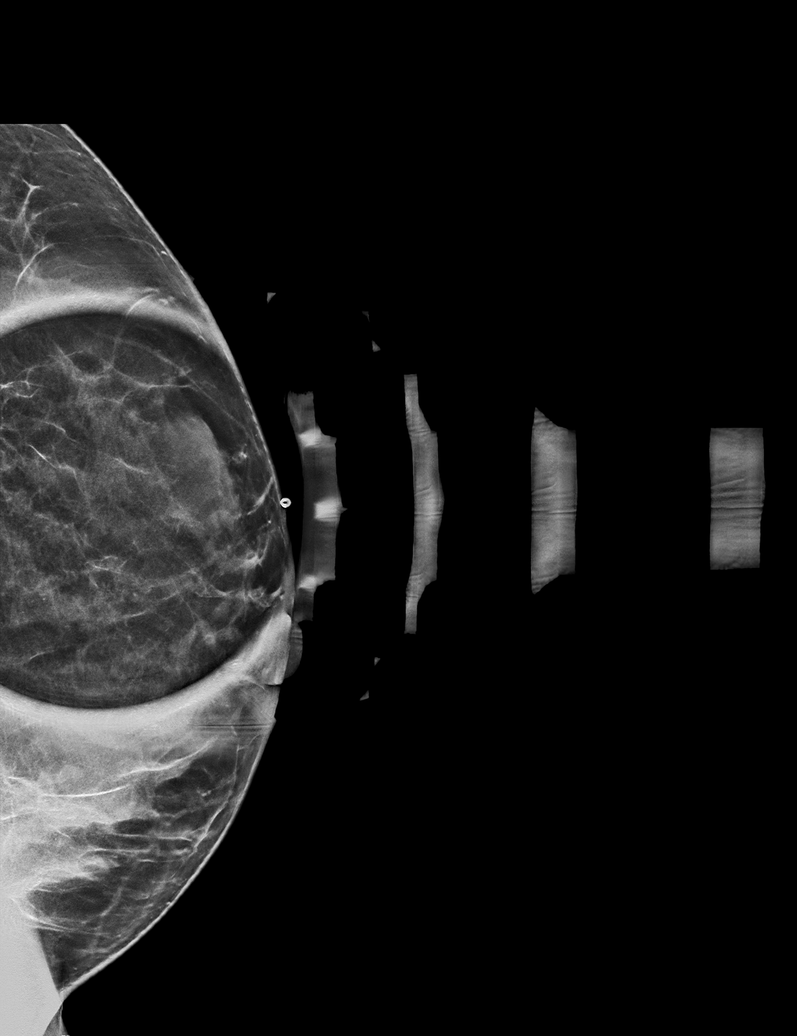

[R CC synth-2D]
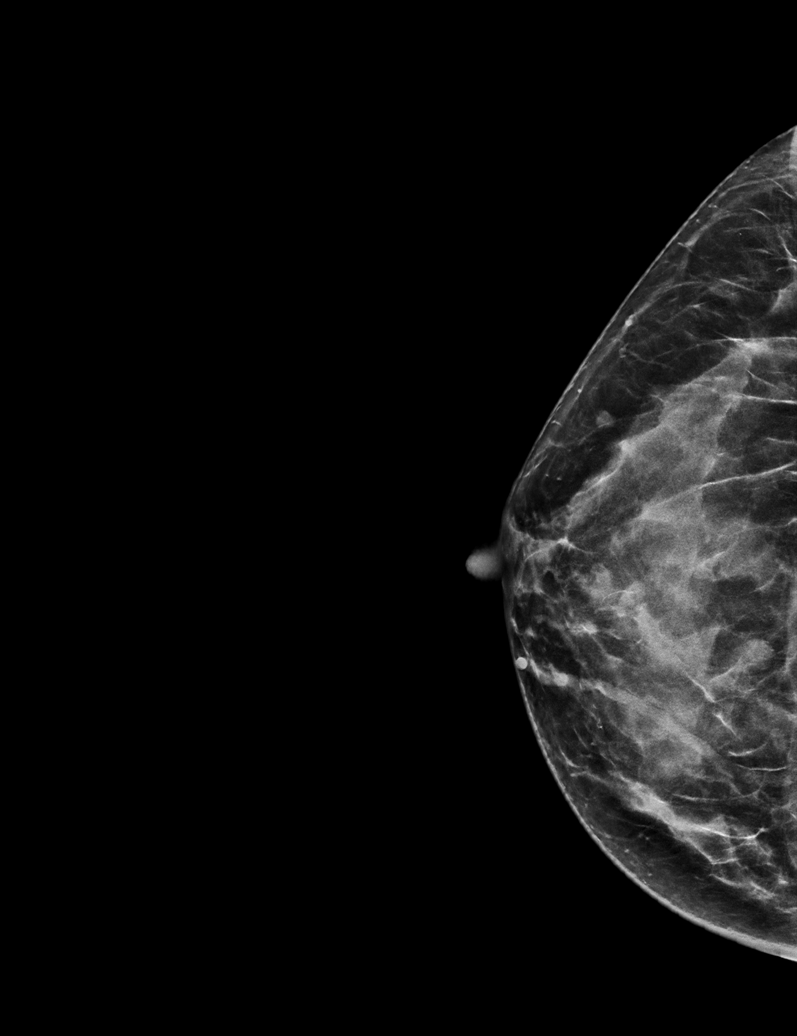

[R MLO tomo · tomo slice 29/57.0]
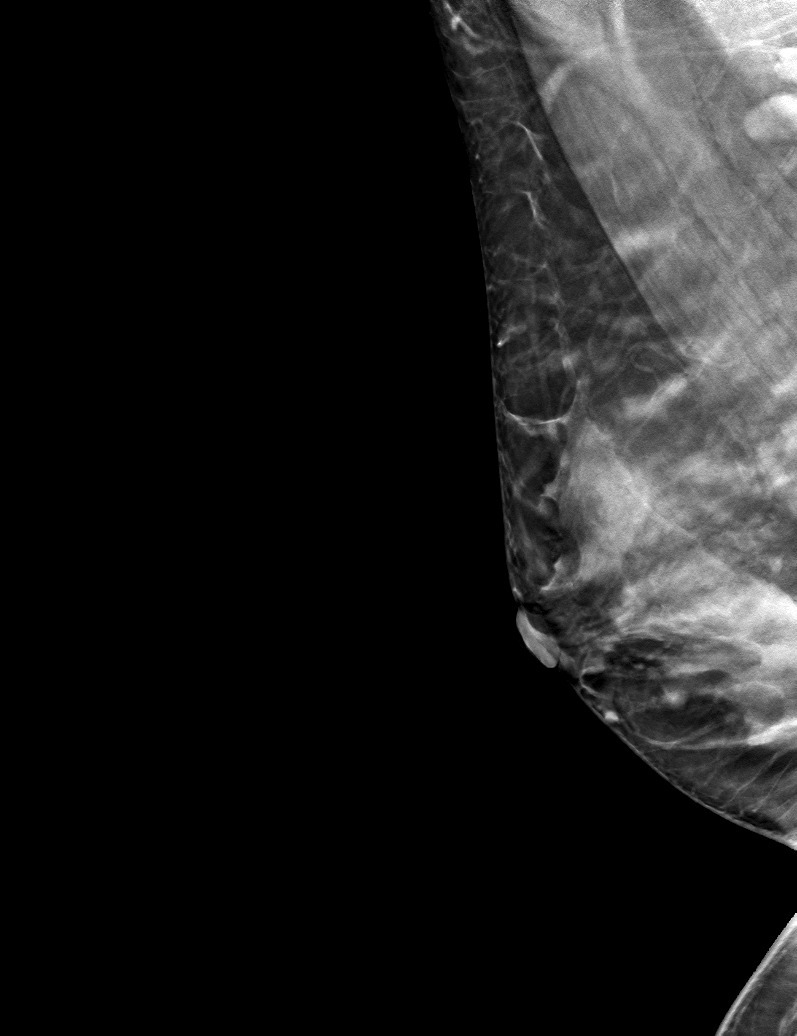

[6 of 30 positions shown; findings below may reference images not displayed]

ACR Breast Density Category c: The breast tissue is heterogeneously
dense, which may obscure small masses.
FINDINGS: In the right breast, there is a partly obscured mass in the
posterior, upper slightly inner breast, 8-9 mm in long axis. No
other right breast masses, no areas of significant asymmetry, no
architectural distortion and no suspicious calcifications.

On the left, there is a questionable obscured mass above the nipple
corresponding to the palpable abnormality. There is no other
evidence of a mass, no significant asymmetry, no architectural
distortion and no suspicious calcifications.

On physical exam, no definite mass is palpated in the supra-areolar
left breast.

Targeted right breast ultrasound is performed, showing a small
hypoechoic mostly circumscribed mass at 1 o'clock, 2 cm the nipple,
measuring 9 x 5 x 7 mm, consistent in size, shape and location to
the mammographic mass. This is most likely a fibroadenoma.

Targeted left breast ultrasound is performed, showing a simple cyst
at 11 o'clock, 2 cm the nipple, corresponding to the palpable
abnormality, measuring 2.3 x 1.1 x 2.4 cm. No solid masses or
suspicious lesions.
IMPRESSION: 1. Probably benign small mass consistent with a fibroadenoma in the
right breast at 1 o'clock, 2 cm the nipple, measuring 9 mm.
Short-term follow-up recommended.
2. Benign left breast cyst corresponds to the palpable abnormality.

RECOMMENDATION:
1. Right breast ultrasound in 6 months to reassess the 1 o'clock
position, 9 mm mass.

I have discussed the findings and recommendations with the patient.
If applicable, a reminder letter will be sent to the patient
regarding the next appointment.

BI-RADS CATEGORY  3: Probably benign.
